# Patient Record
Sex: Female | Born: 1985 | Race: White | Hispanic: No | Marital: Married | Smoking: Former smoker
Health system: Southern US, Community
[De-identification: ages and names within clinical notes are randomized; demographics above are authoritative.]

## PROBLEM LIST (undated history)

## (undated) DIAGNOSIS — K219 Gastro-esophageal reflux disease without esophagitis: Secondary | ICD-10-CM

## (undated) HISTORY — DX: Gastro-esophageal reflux disease without esophagitis: K21.9

## (undated) HISTORY — PX: APPENDECTOMY: SHX54

---

## 2016-05-27 DIAGNOSIS — R1031 Right lower quadrant pain: Secondary | ICD-10-CM | POA: Diagnosis not present

## 2016-05-27 DIAGNOSIS — F3281 Premenstrual dysphoric disorder: Secondary | ICD-10-CM | POA: Diagnosis not present

## 2016-05-27 DIAGNOSIS — N921 Excessive and frequent menstruation with irregular cycle: Secondary | ICD-10-CM | POA: Diagnosis not present

## 2016-06-18 DIAGNOSIS — Z124 Encounter for screening for malignant neoplasm of cervix: Secondary | ICD-10-CM | POA: Diagnosis not present

## 2016-06-18 DIAGNOSIS — F3281 Premenstrual dysphoric disorder: Secondary | ICD-10-CM | POA: Diagnosis not present

## 2016-06-18 DIAGNOSIS — Z01419 Encounter for gynecological examination (general) (routine) without abnormal findings: Secondary | ICD-10-CM | POA: Diagnosis not present

## 2017-01-22 DIAGNOSIS — M545 Low back pain: Secondary | ICD-10-CM | POA: Diagnosis not present

## 2017-01-22 DIAGNOSIS — R109 Unspecified abdominal pain: Secondary | ICD-10-CM | POA: Diagnosis not present

## 2017-01-22 DIAGNOSIS — R399 Unspecified symptoms and signs involving the genitourinary system: Secondary | ICD-10-CM | POA: Diagnosis not present

## 2017-01-22 DIAGNOSIS — K5900 Constipation, unspecified: Secondary | ICD-10-CM | POA: Diagnosis not present

## 2017-05-26 DIAGNOSIS — K469 Unspecified abdominal hernia without obstruction or gangrene: Secondary | ICD-10-CM | POA: Diagnosis not present

## 2017-08-23 DIAGNOSIS — Z124 Encounter for screening for malignant neoplasm of cervix: Secondary | ICD-10-CM | POA: Diagnosis not present

## 2017-08-23 DIAGNOSIS — K219 Gastro-esophageal reflux disease without esophagitis: Secondary | ICD-10-CM | POA: Diagnosis not present

## 2017-08-23 DIAGNOSIS — Z01419 Encounter for gynecological examination (general) (routine) without abnormal findings: Secondary | ICD-10-CM | POA: Diagnosis not present

## 2017-08-23 DIAGNOSIS — K429 Umbilical hernia without obstruction or gangrene: Secondary | ICD-10-CM | POA: Diagnosis not present

## 2017-11-11 ENCOUNTER — Encounter: Payer: Self-pay | Admitting: Family Medicine

## 2017-11-11 ENCOUNTER — Ambulatory Visit (INDEPENDENT_AMBULATORY_CARE_PROVIDER_SITE_OTHER): Payer: BLUE CROSS/BLUE SHIELD | Admitting: Family Medicine

## 2017-11-11 VITALS — BP 118/72 | HR 87 | Temp 98.2°F | Ht 62.0 in | Wt 129.8 lb

## 2017-11-11 DIAGNOSIS — R1310 Dysphagia, unspecified: Secondary | ICD-10-CM

## 2017-11-11 DIAGNOSIS — H6692 Otitis media, unspecified, left ear: Secondary | ICD-10-CM

## 2017-11-11 MED ORDER — AMOXICILLIN 875 MG PO TABS
875.0000 mg | ORAL_TABLET | Freq: Two times a day (BID) | ORAL | 0 refills | Status: DC
Start: 2017-11-11 — End: 2017-11-28

## 2017-11-11 MED ORDER — IPRATROPIUM BROMIDE 0.06 % NA SOLN: 2.0000 | Freq: Four times a day (QID) | NASAL | 0 refills | Status: DC

## 2017-11-11 NOTE — Progress Notes (Signed)
Subjective:  Andrea Lester is a 32 y.o. female who presents today with a chief complaint of left ear pressure and to establish care.   HPI:  Left ear pressure, acute issue Started 4 days ago.  Symptoms worsened over that time.  Associated with cough and sore throat.  She is tried Sudafed and lozenges which is helped a little bit.  Both of her daughters have had similar symptoms.  No fevers or chills.  No nausea or vomiting.  No hearing loss.  No other home treatments tried.  No other obvious alleviating or aggravating factors.  Dysphagia, new problem Several year history.  This was being worked up in [place], [place] prior to her moving to [place] last month.  Reports that she was planning on having a endoscopy done.  Still having some issues with dysphasia and feeling like food is getting stuck in her throat.  She was started on Nexium which has not significantly seem to help.  ROS: Per HPI, otherwise a 10 point review of systems was performed and was negative  PMH:  The following were reviewed and entered/updated in epic: Past Medical History:  Diagnosis Date  . GERD (gastroesophageal reflux disease)    Patient Active Problem List   Diagnosis Date Noted  . Dysphagia 11/11/2017   History reviewed. No pertinent surgical history.  No family history of cancer.  Medications- reviewed and updated Current Outpatient Medications  Medication Sig Dispense Refill  . esomeprazole (NEXIUM) 40 MG capsule Take 40 mg by mouth daily at 12 noon.    Marland Kitchen amoxicillin (AMOXIL) 875 MG tablet Take 1 tablet (875 mg total) by mouth 2 (two) times daily. 14 tablet 0  . ipratropium (ATROVENT) 0.06 % nasal spray Place 2 sprays into both nostrils 4 (four) times daily. 15 mL 0   No current facility-administered medications for this visit.     Allergies-reviewed and updated No Known Allergies  Social History   Socioeconomic History  . Marital status: Married    Spouse name: None  . Number  of children: None  . Years of education: None  . Highest education level: None  Social Needs  . Financial resource strain: None  . Food insecurity - worry: None  . Food insecurity - inability: None  . Transportation needs - medical: None  . Transportation needs - non-medical: None  Occupational History  . None  Tobacco Use  . Smoking status: Former Games developer  . Smokeless tobacco: Never Used  Substance and Sexual Activity  . Alcohol use: Yes  . Drug use: No  . Sexual activity: Yes    Partners: Male  Other Topics Concern  . None  Social History Narrative  . None    Objective:  Physical Exam: BP 118/72 (BP Location: Left Arm, Patient Position: Sitting, Cuff Size: Normal)   Pulse 87   Temp 98.2 F (36.8 C) (Oral)   Ht 5\' 2"  (1.575 m)   Wt 129 lb 12.8 oz (58.9 kg)   SpO2 97%   BMI 23.74 kg/m   Gen: NAD, resting comfortably HEENT: Right TM clear.  Left TM erythematous with effusion.  Oropharynx erythematous without exudate.  Shotty lymphadenopathy. CV: RRR with no murmurs appreciated Pulm: NWOB, CTAB with no crackles, wheezes, or rhonchi GI: Normal bowel sounds present. Soft, Nontender, Nondistended. MSK: No edema, cyanosis, or clubbing noted Skin: Warm, dry Neuro: Grossly normal, moves all extremities Psych: Normal affect and thought content  Assessment/Plan:  Left otitis media Start amoxicillin.  Also start Atrovent  nasal spray for rhinorrhea/nasal congestion.  Recommended Tylenol and/or Motrin as needed for pain.  Encouraged good oral hydration.  Return precautions reviewed.  Follow-up as needed.  Dysphagia Continue Nexium.  Referral to GI placed.  Preventative healthcare Advised patient to return soon for CPE.  Obtain records from previous PCP.  Katina Degreealeb M. Jimmey RalphParker, MD 11/11/2017 3:39 PM

## 2017-11-11 NOTE — Patient Instructions (Signed)
Start the atrovent and amoxicillin.  I will refer you to GI.  Please drink plenty of fluids.  Let me know if your symptoms worsen, or fail to improve in the next few days.  Come back soon for a full physical.  Take care, Dr Jimmey RalphParker

## 2017-11-28 ENCOUNTER — Ambulatory Visit: Payer: BLUE CROSS/BLUE SHIELD | Admitting: Family Medicine

## 2017-11-28 ENCOUNTER — Encounter: Payer: Self-pay | Admitting: Family Medicine

## 2017-11-28 VITALS — BP 116/70 | HR 72 | Temp 98.4°F | Ht 62.0 in | Wt 130.2 lb

## 2017-11-28 DIAGNOSIS — R1310 Dysphagia, unspecified: Secondary | ICD-10-CM

## 2017-11-28 DIAGNOSIS — R059 Cough, unspecified: Secondary | ICD-10-CM

## 2017-11-28 DIAGNOSIS — R05 Cough: Secondary | ICD-10-CM | POA: Diagnosis not present

## 2017-11-28 MED ORDER — PREDNISONE 50 MG PO TABS
ORAL_TABLET | ORAL | 0 refills | Status: DC
Start: 2017-11-28 — End: 2018-11-13

## 2017-11-28 MED ORDER — IPRATROPIUM BROMIDE 0.06 % NA SOLN: 2.0000 | Freq: Four times a day (QID) | NASAL | 0 refills | Status: DC

## 2017-11-28 MED ORDER — DOXYCYCLINE HYCLATE 100 MG PO TABS: 100.0000 mg | ORAL_TABLET | Freq: Two times a day (BID) | ORAL | 0 refills | Status: DC

## 2017-11-28 NOTE — Progress Notes (Signed)
    Subjective:  Andrea Lester is a 32 y.o. female who presents today for same-day appointment with a chief complaint of sinusitis.   HPI:  Cough, acute issue Symptoms started about a week ago.  Stable over that time.  Associated with sore throat, rhinorrhea, and sinus congestion.  No fevers or chills.  Both of her children have also been sick.  She has tried over-the-counter cough medications which is helped a little bit.  No ear pain or pressure.  No other obvious alleviating or aggravating factors.  Dysphagia, established problem, stable Patient seen for this about 3 weeks ago.  Referral was placed to GI however patient has not yet heard from them.  ROS: Per HPI  PMH: She reports that she has quit smoking. she has never used smokeless tobacco. She reports that she drinks alcohol. She reports that she does not use drugs.  Objective:  Physical Exam: BP 116/70 (BP Location: Left Arm, Patient Position: Sitting, Cuff Size: Normal)   Pulse 72   Temp 98.4 F (36.9 C) (Oral)   Ht 5\' 2"  (1.575 m)   Wt 130 lb 3.2 oz (59.1 kg)   SpO2 98%   BMI 23.81 kg/m   Gen: NAD, resting comfortably HEENT: Right TM clear.  Left TM with clear effusion.  Oropharynx erythematous without exudate.  Nasal mucosa erythematous and boggy with clear nasal discharge.  Maxillary sinuses clear to transillumination bilaterally. CV: RRR with no murmurs appreciated Pulm: NWOB, CTAB with no crackles, wheezes, or rhonchi  Assessment/Plan:  Cough Likely secondary to viral URI. No signs of bacterial infection. Start atrovent for rhinorrhea/sinus congestion. Will give 2 day burst of prednisone. Sent in a "pocket prescription" for doxycycline with strict instruction to not start unless symptoms worse or fail to improve within the next several days. Recommended tylenol and/or motrin as needed for low grade fever and pain. Encouraged good oral hydration. Return precautions reviewed. Follow up as needed.   Dysphagia Told  patient that referral was placed.  While in office, she checked her voicemails and noted that she had a missed call from the GI office.  Advised patient that she should call the GI office to schedule her appointment.  Patient voiced understanding.   Katina Degreealeb M. Jimmey RalphParker, MD 11/28/2017 1:06 PM

## 2017-11-28 NOTE — Patient Instructions (Signed)
Start the atrovent and prednisone.  Start the doxycycline if your symptoms worsen or do not improve in a few days.  Please stay well hydrated.  You can take tylenol and/or motrin as needed for low grade fever and pain.  Please let me know if your symptoms worsen or fail to improve.  Call [facility] to schedule your appointment: Address: [address], [place], [place] [address] Phone: [phone]  Take care, Dr Jimmey RalphParker

## 2018-01-09 ENCOUNTER — Encounter: Payer: Self-pay | Admitting: Family Medicine

## 2018-02-20 DIAGNOSIS — J018 Other acute sinusitis: Secondary | ICD-10-CM | POA: Diagnosis not present

## 2018-02-20 DIAGNOSIS — J309 Allergic rhinitis, unspecified: Secondary | ICD-10-CM | POA: Diagnosis not present

## 2018-03-15 DIAGNOSIS — N6459 Other signs and symptoms in breast: Secondary | ICD-10-CM | POA: Diagnosis not present

## 2018-03-15 DIAGNOSIS — R102 Pelvic and perineal pain: Secondary | ICD-10-CM | POA: Diagnosis not present

## 2018-03-16 ENCOUNTER — Other Ambulatory Visit: Payer: Self-pay | Admitting: Obstetrics & Gynecology

## 2018-03-16 DIAGNOSIS — N6320 Unspecified lump in the left breast, unspecified quadrant: Secondary | ICD-10-CM

## 2018-04-14 ENCOUNTER — Other Ambulatory Visit: Payer: BLUE CROSS/BLUE SHIELD

## 2018-04-17 ENCOUNTER — Other Ambulatory Visit: Payer: BLUE CROSS/BLUE SHIELD

## 2018-04-18 ENCOUNTER — Ambulatory Visit
Admission: RE | Admit: 2018-04-18 | Discharge: 2018-04-18 | Disposition: A | Discharge status: Home / Self Care | Payer: BLUE CROSS/BLUE SHIELD | Source: Ambulatory Visit | Attending: Obstetrics & Gynecology | Admitting: Obstetrics & Gynecology

## 2018-04-18 DIAGNOSIS — N6489 Other specified disorders of breast: Secondary | ICD-10-CM | POA: Diagnosis not present

## 2018-04-18 DIAGNOSIS — N6320 Unspecified lump in the left breast, unspecified quadrant: Secondary | ICD-10-CM

## 2018-04-18 DIAGNOSIS — R922 Inconclusive mammogram: Secondary | ICD-10-CM | POA: Diagnosis not present

## 2018-09-05 DIAGNOSIS — M9901 Segmental and somatic dysfunction of cervical region: Secondary | ICD-10-CM | POA: Diagnosis not present

## 2018-09-05 DIAGNOSIS — M4003 Postural kyphosis, cervicothoracic region: Secondary | ICD-10-CM | POA: Diagnosis not present

## 2018-09-05 DIAGNOSIS — M9905 Segmental and somatic dysfunction of pelvic region: Secondary | ICD-10-CM | POA: Diagnosis not present

## 2018-09-05 DIAGNOSIS — Q72812 Congenital shortening of left lower limb: Secondary | ICD-10-CM | POA: Diagnosis not present

## 2018-09-07 DIAGNOSIS — M4003 Postural kyphosis, cervicothoracic region: Secondary | ICD-10-CM | POA: Diagnosis not present

## 2018-09-07 DIAGNOSIS — M9901 Segmental and somatic dysfunction of cervical region: Secondary | ICD-10-CM | POA: Diagnosis not present

## 2018-09-07 DIAGNOSIS — Q72812 Congenital shortening of left lower limb: Secondary | ICD-10-CM | POA: Diagnosis not present

## 2018-09-07 DIAGNOSIS — M9905 Segmental and somatic dysfunction of pelvic region: Secondary | ICD-10-CM | POA: Diagnosis not present

## 2018-09-12 DIAGNOSIS — M5382 Other specified dorsopathies, cervical region: Secondary | ICD-10-CM | POA: Diagnosis not present

## 2018-09-12 DIAGNOSIS — M9901 Segmental and somatic dysfunction of cervical region: Secondary | ICD-10-CM | POA: Diagnosis not present

## 2018-09-12 DIAGNOSIS — M9905 Segmental and somatic dysfunction of pelvic region: Secondary | ICD-10-CM | POA: Diagnosis not present

## 2018-09-12 DIAGNOSIS — Q72812 Congenital shortening of left lower limb: Secondary | ICD-10-CM | POA: Diagnosis not present

## 2018-09-15 DIAGNOSIS — M5382 Other specified dorsopathies, cervical region: Secondary | ICD-10-CM | POA: Diagnosis not present

## 2018-09-15 DIAGNOSIS — M9905 Segmental and somatic dysfunction of pelvic region: Secondary | ICD-10-CM | POA: Diagnosis not present

## 2018-09-15 DIAGNOSIS — Q72812 Congenital shortening of left lower limb: Secondary | ICD-10-CM | POA: Diagnosis not present

## 2018-09-15 DIAGNOSIS — M9901 Segmental and somatic dysfunction of cervical region: Secondary | ICD-10-CM | POA: Diagnosis not present

## 2018-09-26 DIAGNOSIS — M955 Acquired deformity of pelvis: Secondary | ICD-10-CM | POA: Diagnosis not present

## 2018-09-26 DIAGNOSIS — Q760 Spina bifida occulta: Secondary | ICD-10-CM | POA: Diagnosis not present

## 2018-09-28 DIAGNOSIS — M9901 Segmental and somatic dysfunction of cervical region: Secondary | ICD-10-CM | POA: Diagnosis not present

## 2018-09-28 DIAGNOSIS — M5382 Other specified dorsopathies, cervical region: Secondary | ICD-10-CM | POA: Diagnosis not present

## 2018-09-28 DIAGNOSIS — Q72812 Congenital shortening of left lower limb: Secondary | ICD-10-CM | POA: Diagnosis not present

## 2018-09-28 DIAGNOSIS — M9905 Segmental and somatic dysfunction of pelvic region: Secondary | ICD-10-CM | POA: Diagnosis not present

## 2018-10-03 DIAGNOSIS — M9901 Segmental and somatic dysfunction of cervical region: Secondary | ICD-10-CM | POA: Diagnosis not present

## 2018-10-03 DIAGNOSIS — M9905 Segmental and somatic dysfunction of pelvic region: Secondary | ICD-10-CM | POA: Diagnosis not present

## 2018-10-03 DIAGNOSIS — M5382 Other specified dorsopathies, cervical region: Secondary | ICD-10-CM | POA: Diagnosis not present

## 2018-10-03 DIAGNOSIS — Q72812 Congenital shortening of left lower limb: Secondary | ICD-10-CM | POA: Diagnosis not present

## 2018-10-05 DIAGNOSIS — M9901 Segmental and somatic dysfunction of cervical region: Secondary | ICD-10-CM | POA: Diagnosis not present

## 2018-10-05 DIAGNOSIS — M9905 Segmental and somatic dysfunction of pelvic region: Secondary | ICD-10-CM | POA: Diagnosis not present

## 2018-10-05 DIAGNOSIS — M5382 Other specified dorsopathies, cervical region: Secondary | ICD-10-CM | POA: Diagnosis not present

## 2018-10-05 DIAGNOSIS — Q72812 Congenital shortening of left lower limb: Secondary | ICD-10-CM | POA: Diagnosis not present

## 2018-10-10 DIAGNOSIS — M5382 Other specified dorsopathies, cervical region: Secondary | ICD-10-CM | POA: Diagnosis not present

## 2018-10-10 DIAGNOSIS — M9901 Segmental and somatic dysfunction of cervical region: Secondary | ICD-10-CM | POA: Diagnosis not present

## 2018-10-10 DIAGNOSIS — M9905 Segmental and somatic dysfunction of pelvic region: Secondary | ICD-10-CM | POA: Diagnosis not present

## 2018-10-10 DIAGNOSIS — Q72812 Congenital shortening of left lower limb: Secondary | ICD-10-CM | POA: Diagnosis not present

## 2018-10-24 DIAGNOSIS — Q72812 Congenital shortening of left lower limb: Secondary | ICD-10-CM | POA: Diagnosis not present

## 2018-10-24 DIAGNOSIS — M9905 Segmental and somatic dysfunction of pelvic region: Secondary | ICD-10-CM | POA: Diagnosis not present

## 2018-10-24 DIAGNOSIS — M5382 Other specified dorsopathies, cervical region: Secondary | ICD-10-CM | POA: Diagnosis not present

## 2018-10-24 DIAGNOSIS — M9901 Segmental and somatic dysfunction of cervical region: Secondary | ICD-10-CM | POA: Diagnosis not present

## 2018-10-31 DIAGNOSIS — M9905 Segmental and somatic dysfunction of pelvic region: Secondary | ICD-10-CM | POA: Diagnosis not present

## 2018-10-31 DIAGNOSIS — M5382 Other specified dorsopathies, cervical region: Secondary | ICD-10-CM | POA: Diagnosis not present

## 2018-10-31 DIAGNOSIS — Q72812 Congenital shortening of left lower limb: Secondary | ICD-10-CM | POA: Diagnosis not present

## 2018-10-31 DIAGNOSIS — M9901 Segmental and somatic dysfunction of cervical region: Secondary | ICD-10-CM | POA: Diagnosis not present

## 2018-11-07 DIAGNOSIS — M9905 Segmental and somatic dysfunction of pelvic region: Secondary | ICD-10-CM | POA: Diagnosis not present

## 2018-11-07 DIAGNOSIS — M9901 Segmental and somatic dysfunction of cervical region: Secondary | ICD-10-CM | POA: Diagnosis not present

## 2018-11-07 DIAGNOSIS — Q72812 Congenital shortening of left lower limb: Secondary | ICD-10-CM | POA: Diagnosis not present

## 2018-11-07 DIAGNOSIS — M5382 Other specified dorsopathies, cervical region: Secondary | ICD-10-CM | POA: Diagnosis not present

## 2018-11-13 ENCOUNTER — Ambulatory Visit: Payer: BLUE CROSS/BLUE SHIELD | Admitting: Physician Assistant

## 2018-11-13 ENCOUNTER — Encounter: Payer: Self-pay | Admitting: Physician Assistant

## 2018-11-13 VITALS — BP 110/72 | HR 59 | Temp 97.7°F | Ht 62.0 in | Wt 137.0 lb

## 2018-11-13 DIAGNOSIS — R21 Rash and other nonspecific skin eruption: Secondary | ICD-10-CM | POA: Diagnosis not present

## 2018-11-13 DIAGNOSIS — L237 Allergic contact dermatitis due to plants, except food: Secondary | ICD-10-CM | POA: Diagnosis not present

## 2018-11-13 MED ORDER — METHYLPREDNISOLONE ACETATE 80 MG/ML IJ SUSP
80.0000 mg | Freq: Once | INTRAMUSCULAR | Status: AC
  Administered 2018-11-13: 80 mg via INTRAMUSCULAR

## 2018-11-13 MED ORDER — PREDNISONE 10 MG PO TABS: ORAL_TABLET | ORAL | 0 refills | Status: DC

## 2018-11-13 NOTE — Patient Instructions (Signed)
It was great to see you!  1. Start oral steroid TOMORROW. Take two tablets (20mg ) daily x 1 week, then one tablet (10mg ) daily x 1 week. 2. Start daily antihistamine such as Allegra, Claritin, or Zyrtec (generic is fine) x 2 weeks. 3. Start daily antacid medication, such as cimetidine or pepcid, x 2 weeks.   Take care,  Jarold Motto PA-C   Poison [facility] Dermatitis  Poison oak dermatitis is inflammation of the skin that is caused by contact with the allergens on the leaves of the poison oak (toxicodendron) plant. The skin reaction often includes redness, swelling, blisters, and extreme itching. What are the causes? This condition is caused by a specific chemical (urushiol) that is found in the sap of the poison oak plant. This chemical is sticky and it can be easily spread to people, animals, and objects. You can get poison oak dermatitis by:  Having direct contact with a poison oak plant.  Touching animals, other people, or objects that have come in contact with poison oak and have the chemical on them. What increases the risk? This condition is more likely to develop in people who:  Are outdoors often.  Go outdoors without wearing protective clothing, such as closed shoes, long pants, and a long-sleeved shirt. What are the signs or symptoms? Symptoms of this condition include:  Redness of the skin.  A rash that may develop blisters.  Extreme itching.  Swelling. This may occur if the reaction is more severe. Symptoms usually last for 1-2 weeks. However, the first time you develop this condition, symptoms may last 3-4 weeks. How is this diagnosed? This condition may be diagnosed based on your symptoms and a physical exam. Your health care provider may also ask you about any recent outdoor activity. How is this treated? Treatment for this condition will vary depending on how severe it is. Treatment may include:  Hydrocortisone creams or calamine lotions to relieve  itching.  Oatmeal baths to soothe the skin.  Over-the-counter antihistamine tablets.  Oral steroid medicine for more severe outbreaks. Follow these instructions at home:  Take or apply over-the-counter and prescription medicines only as told by your health care provider.  Wash exposed skin as soon as possible with soap and cold water.  Use hydrocortisone creams or calamine lotion as needed to soothe the skin and relieve itching.  Take oatmeal baths as needed. Use colloidal oatmeal. You can get this at your local pharmacy or grocery store. Follow the instructions on the packaging.  Do not scratch or rub your skin.  While you have the rash, wash clothes right after you wear them. How is this prevented?  Learn to identify the poison oak plant and avoid contact with the plant. This plant can be recognized by the number of leaves. Generally, poison oak has three leaves with flowering branches on a single stem. The leaves are often a bit fuzzy and have a toothlike edge.  If you have been exposed to poison oak, thoroughly wash with soap and water right away. You have about 30 minutes to remove the plant resin before it will cause the rash. Be sure to wash under your fingernails because any plant resin there will continue to spread the rash.  When hiking or camping, wear clothes that will help you avoid exposure on the skin. This includes long pants, a long-sleeved shirt, tall socks, and hiking boots. You can also apply preventive lotion to your skin to help limit exposure.  If you suspect that your  clothes or outdoor gear came in contact with poison oak, rinse them off outside with a garden hose before bringing them inside your house. Contact a health care provider if:  You have open sores in the rash area.  You have more redness, swelling, or pain in the affected area.  You have redness that spreads beyond the rash area.  You have fluid, blood, or pus coming from the affected  area.  You have a fever.  You have a rash over a large area of your body.  You have a rash on your eyes, mouth, or genitals.  Your rash does not improve after a few days. Get help right away if:  Your face swells or your eyes swell shut.  You have trouble breathing.  You have trouble swallowing. This information is not intended to replace advice given to you by your health care provider. Make sure you discuss any questions you have with your health care provider. Document Released: 04/17/2003 Document Revised: 05/26/2017 Document Reviewed: 03/19/2015 Elsevier Interactive Patient Education  2019 [employer].

## 2018-11-13 NOTE — Progress Notes (Signed)
Andrea Lester is a 33 y.o. female here for a new problem.  I acted as a Neurosurgeon for [employer], PA-C Corky Mull, LPN  History of Present Illness:   Chief Complaint  Patient presents with  . Poison [employer]  This is a new problem. The current episode started in the past 7 days. The problem has been gradually worsening since onset. The rash is diffuse. The rash is characterized by redness, blistering, itchiness and scaling. She was exposed to plant contact (Pulling weeds in yard last Sunday). Pertinent negatives include no cough, diarrhea, facial edema, fever, joint pain, shortness of breath, sore throat or vomiting. Past treatments include antihistamine, topical steroids and anti-itch cream. The treatment provided mild relief.   She has received oral steroids in the past and tolerated without issues.  She has tried oral benadryl but it makes her sleepy.  Past Medical History:  Diagnosis Date  . GERD (gastroesophageal reflux disease)      Social History   Socioeconomic History  . Marital status: Married    Spouse name: Not on file  . Number of children: Not on file  . Years of education: Not on file  . Highest education level: Not on file  Occupational History  . Not on file  Social Needs  . Financial resource strain: Not on file  . Food insecurity:    Worry: Not on file    Inability: Not on file  . Transportation needs:    Medical: Not on file    Non-medical: Not on file  Tobacco Use  . Smoking status: Former Games developer  . Smokeless tobacco: Never Used  Substance and Sexual Activity  . Alcohol use: Yes  . Drug use: No  . Sexual activity: Yes    Partners: Male  Lifestyle  . Physical activity:    Days per week: Not on file    Minutes per session: Not on file  . Stress: Not on file  Relationships  . Social connections:    Talks on phone: Not on file    Gets together: Not on file    Attends religious service: Not on file    Active member of club or  organization: Not on file    Attends meetings of clubs or organizations: Not on file    Relationship status: Not on file  . Intimate partner violence:    Fear of current or ex partner: Not on file    Emotionally abused: Not on file    Physically abused: Not on file    Forced sexual activity: Not on file  Other Topics Concern  . Not on file  Social History Narrative  . Not on file    History reviewed. No pertinent surgical history.  History reviewed. No pertinent family history.  No Known Allergies  Current Medications:   Current Outpatient Medications:  .  predniSONE (DELTASONE) 10 MG tablet, Take two tablets daily x 1 week, then one tablet daily x 1 week., Disp: 21 tablet, Rfl: 0   Review of Systems:   Review of Systems  Constitutional: Negative for fever.  HENT: Negative for sore throat.   Respiratory: Negative for cough and shortness of breath.   Gastrointestinal: Negative for diarrhea and vomiting.  Musculoskeletal: Negative for joint pain.    Vitals:   Vitals:   11/13/18 0806  BP: 110/72  Pulse: (!) 59  Temp: 97.7 F (36.5 C)  TempSrc: Oral  SpO2: 98%  Weight: 137 lb (62.1 kg)  Height: 5\' 2"  (1.575 m)     Body mass index is 25.06 kg/m.  Physical Exam:   Physical Exam Vitals signs and nursing note reviewed.  Constitutional:      General: She is not in acute distress.    Appearance: She is well-developed. She is not ill-appearing or toxic-appearing.  Cardiovascular:     Rate and Rhythm: Normal rate and regular rhythm.     Pulses: Normal pulses.     Heart sounds: Normal heart sounds, S1 normal and S2 normal.     Comments: No LE edema Pulmonary:     Effort: Pulmonary effort is normal.     Breath sounds: Normal breath sounds.  Skin:    General: Skin is warm and dry.     Comments: Erythematous patches with sand-paper like texture spread throughout upper chest and bilateral upper extremities  Neurological:     Mental Status: She is alert.     GCS:  GCS eye subscore is 4. GCS verbal subscore is 5. GCS motor subscore is 6.  Psychiatric:        Speech: Speech normal.        Behavior: Behavior normal. Behavior is cooperative.     No results found for this or any previous visit.  Assessment and Plan:   [name] was seen today for poison ivy.  Diagnoses and all orders for this visit:  Rash and nonspecific skin eruption No red flags on exam.  She has not responded to OTC remedies. Received depo-medrol injection in office today. Start oral prednisone tomorrow. Also start daily non-sedating antihistamine and H2 blocker for at least 1 week.  Discussed taking medications as prescribed. Reviewed return precautions and ER precautions. -     methylPREDNISolone acetate (DEPO-MEDROL) injection 80 mg  Other orders -     predniSONE (DELTASONE) 10 MG tablet; Take two tablets daily x 1 week, then one tablet daily x 1 week.   . Reviewed expectations re: course of current medical issues. . Discussed self-management of symptoms. . Outlined signs and symptoms indicating need for more acute intervention. . Patient verbalized understanding and all questions were answered. . See orders for this visit as documented in the electronic medical record. . Patient received an After-Visit Summary.  CMA or LPN served as scribe during this visit. History, Physical, and Plan performed by medical provider. The above documentation has been reviewed and is accurate and complete.   Jarold MottoSamantha Simisola Sandles, PA-C

## 2018-11-14 ENCOUNTER — Ambulatory Visit: Payer: BLUE CROSS/BLUE SHIELD | Admitting: Family Medicine

## 2018-11-16 ENCOUNTER — Ambulatory Visit: Payer: Self-pay

## 2018-11-16 NOTE — Telephone Encounter (Addendum)
Left message on voicemail to call office. Discussed message with Lelon Mast. Pt can decrease Prednisone to 10 mg daily x 1 week and then decrease to 5 mg x 1 week due to side effect.   CRM CREATED

## 2018-11-16 NOTE — Telephone Encounter (Signed)
See note

## 2018-11-16 NOTE — Telephone Encounter (Signed)
Pt called to say she started to take prednisone for her rash on Monday.  Her instructions are to take 2 per day week 1. Then 1 per day week 2. She is wanting to decrease the dose because of the side effects. Pt is taking medication in the morning with food.  Pt was educated on how prednisone works and that decreasing the dose could mean a return of her symptoms. Note will be routed to provider.Pt advised to contact pharmacist for advice . Pt verbalized understanding of all information.   Reason for Disposition . Caller has medication question about med not prescribed by PCP and triager unable to answer question (e.g., compatibility with other med, storage)  Answer Assessment - Initial Assessment Questions 1. SYMPTOMS: "Do you have any symptoms?"     Still itchy.  No spread of the rash 2. SEVERITY: If symptoms are present, ask "Are they mild, moderate or severe?"     Still itch but improving no spreading.  Medications makes her feel edgy  Protocols used: MEDICATION QUESTION CALL-A-AH

## 2018-11-17 NOTE — Telephone Encounter (Signed)
Spoke to pt, told her calling about Prednisone,  Discussed message with Lelon Mast. you can decrease Prednisone to 10 mg daily x 1 week and then decrease to 5 mg x 1 week due to side effect. Pt verbalized understanding.

## 2018-11-21 DIAGNOSIS — M5382 Other specified dorsopathies, cervical region: Secondary | ICD-10-CM | POA: Diagnosis not present

## 2018-11-21 DIAGNOSIS — M9901 Segmental and somatic dysfunction of cervical region: Secondary | ICD-10-CM | POA: Diagnosis not present

## 2018-11-21 DIAGNOSIS — M9905 Segmental and somatic dysfunction of pelvic region: Secondary | ICD-10-CM | POA: Diagnosis not present

## 2018-11-21 DIAGNOSIS — Q72812 Congenital shortening of left lower limb: Secondary | ICD-10-CM | POA: Diagnosis not present

## 2019-04-30 DIAGNOSIS — Z1331 Encounter for screening for depression: Secondary | ICD-10-CM | POA: Diagnosis not present

## 2019-04-30 DIAGNOSIS — F329 Major depressive disorder, single episode, unspecified: Secondary | ICD-10-CM | POA: Diagnosis not present

## 2019-04-30 DIAGNOSIS — F419 Anxiety disorder, unspecified: Secondary | ICD-10-CM | POA: Diagnosis not present

## 2019-04-30 DIAGNOSIS — J309 Allergic rhinitis, unspecified: Secondary | ICD-10-CM | POA: Diagnosis not present

## 2019-04-30 DIAGNOSIS — M2550 Pain in unspecified joint: Secondary | ICD-10-CM | POA: Diagnosis not present

## 2019-05-28 ENCOUNTER — Other Ambulatory Visit: Payer: Self-pay

## 2019-05-28 DIAGNOSIS — R6889 Other general symptoms and signs: Secondary | ICD-10-CM | POA: Diagnosis not present

## 2019-05-28 DIAGNOSIS — Z20822 Contact with and (suspected) exposure to covid-19: Secondary | ICD-10-CM

## 2019-05-29 DIAGNOSIS — B9729 Other coronavirus as the cause of diseases classified elsewhere: Secondary | ICD-10-CM | POA: Diagnosis not present

## 2019-05-29 LAB — NOVEL CORONAVIRUS, NAA: SARS-CoV-2, NAA: DETECTED — AB

## 2019-05-30 ENCOUNTER — Ambulatory Visit: Payer: Self-pay

## 2019-05-30 NOTE — Telephone Encounter (Signed)
Patient requesting to review Covid-19 protocol.,

## 2019-06-01 NOTE — Telephone Encounter (Signed)
Patient has no question was tested positive for covid 19.

## 2019-07-20 DIAGNOSIS — Z20828 Contact with and (suspected) exposure to other viral communicable diseases: Secondary | ICD-10-CM | POA: Diagnosis not present

## 2019-08-20 DIAGNOSIS — Z87891 Personal history of nicotine dependence: Secondary | ICD-10-CM | POA: Diagnosis not present

## 2019-08-20 DIAGNOSIS — Z7289 Other problems related to lifestyle: Secondary | ICD-10-CM | POA: Diagnosis not present

## 2019-08-20 DIAGNOSIS — R430 Anosmia: Secondary | ICD-10-CM | POA: Diagnosis not present

## 2019-08-20 DIAGNOSIS — Z8709 Personal history of other diseases of the respiratory system: Secondary | ICD-10-CM | POA: Diagnosis not present

## 2019-10-08 DIAGNOSIS — H9313 Tinnitus, bilateral: Secondary | ICD-10-CM | POA: Diagnosis not present

## 2019-10-08 DIAGNOSIS — R438 Other disturbances of smell and taste: Secondary | ICD-10-CM | POA: Diagnosis not present

## 2019-11-06 DIAGNOSIS — H9313 Tinnitus, bilateral: Secondary | ICD-10-CM | POA: Diagnosis not present

## 2019-12-04 DIAGNOSIS — Z0000 Encounter for general adult medical examination without abnormal findings: Secondary | ICD-10-CM | POA: Diagnosis not present

## 2019-12-04 DIAGNOSIS — R7989 Other specified abnormal findings of blood chemistry: Secondary | ICD-10-CM | POA: Diagnosis not present

## 2019-12-04 DIAGNOSIS — M5412 Radiculopathy, cervical region: Secondary | ICD-10-CM | POA: Diagnosis not present

## 2019-12-04 DIAGNOSIS — M5413 Radiculopathy, cervicothoracic region: Secondary | ICD-10-CM | POA: Diagnosis not present

## 2019-12-04 DIAGNOSIS — M9901 Segmental and somatic dysfunction of cervical region: Secondary | ICD-10-CM | POA: Diagnosis not present

## 2019-12-04 DIAGNOSIS — M7912 Myalgia of auxiliary muscles, head and neck: Secondary | ICD-10-CM | POA: Diagnosis not present

## 2019-12-06 DIAGNOSIS — M5412 Radiculopathy, cervical region: Secondary | ICD-10-CM | POA: Diagnosis not present

## 2019-12-06 DIAGNOSIS — M9901 Segmental and somatic dysfunction of cervical region: Secondary | ICD-10-CM | POA: Diagnosis not present

## 2019-12-06 DIAGNOSIS — M7912 Myalgia of auxiliary muscles, head and neck: Secondary | ICD-10-CM | POA: Diagnosis not present

## 2019-12-06 DIAGNOSIS — M5413 Radiculopathy, cervicothoracic region: Secondary | ICD-10-CM | POA: Diagnosis not present

## 2019-12-06 DIAGNOSIS — R82998 Other abnormal findings in urine: Secondary | ICD-10-CM | POA: Diagnosis not present

## 2019-12-07 DIAGNOSIS — F329 Major depressive disorder, single episode, unspecified: Secondary | ICD-10-CM | POA: Diagnosis not present

## 2019-12-07 DIAGNOSIS — N3090 Cystitis, unspecified without hematuria: Secondary | ICD-10-CM | POA: Diagnosis not present

## 2019-12-07 DIAGNOSIS — M2550 Pain in unspecified joint: Secondary | ICD-10-CM | POA: Diagnosis not present

## 2019-12-07 DIAGNOSIS — Z0000 Encounter for general adult medical examination without abnormal findings: Secondary | ICD-10-CM | POA: Diagnosis not present

## 2019-12-07 DIAGNOSIS — R430 Anosmia: Secondary | ICD-10-CM | POA: Diagnosis not present

## 2019-12-11 DIAGNOSIS — M5413 Radiculopathy, cervicothoracic region: Secondary | ICD-10-CM | POA: Diagnosis not present

## 2019-12-11 DIAGNOSIS — M5412 Radiculopathy, cervical region: Secondary | ICD-10-CM | POA: Diagnosis not present

## 2019-12-11 DIAGNOSIS — M9901 Segmental and somatic dysfunction of cervical region: Secondary | ICD-10-CM | POA: Diagnosis not present

## 2019-12-11 DIAGNOSIS — M7912 Myalgia of auxiliary muscles, head and neck: Secondary | ICD-10-CM | POA: Diagnosis not present

## 2019-12-11 DIAGNOSIS — Z1212 Encounter for screening for malignant neoplasm of rectum: Secondary | ICD-10-CM | POA: Diagnosis not present

## 2020-01-04 ENCOUNTER — Encounter (HOSPITAL_COMMUNITY): Payer: Self-pay

## 2020-01-04 ENCOUNTER — Other Ambulatory Visit: Payer: Self-pay

## 2020-01-04 DIAGNOSIS — K659 Peritonitis, unspecified: Secondary | ICD-10-CM | POA: Diagnosis not present

## 2020-01-04 DIAGNOSIS — K5792 Diverticulitis of intestine, part unspecified, without perforation or abscess without bleeding: Secondary | ICD-10-CM | POA: Diagnosis not present

## 2020-01-04 DIAGNOSIS — K5732 Diverticulitis of large intestine without perforation or abscess without bleeding: Secondary | ICD-10-CM | POA: Diagnosis not present

## 2020-01-04 DIAGNOSIS — Z20822 Contact with and (suspected) exposure to covid-19: Secondary | ICD-10-CM | POA: Diagnosis present

## 2020-01-04 DIAGNOSIS — Z87891 Personal history of nicotine dependence: Secondary | ICD-10-CM | POA: Diagnosis not present

## 2020-01-04 DIAGNOSIS — R1011 Right upper quadrant pain: Secondary | ICD-10-CM | POA: Diagnosis not present

## 2020-01-04 DIAGNOSIS — R0602 Shortness of breath: Secondary | ICD-10-CM | POA: Diagnosis not present

## 2020-01-04 DIAGNOSIS — R1031 Right lower quadrant pain: Secondary | ICD-10-CM | POA: Diagnosis not present

## 2020-01-04 LAB — CBC
HCT: 37.7 % (ref 36.0–46.0)
Hemoglobin: 12 g/dL (ref 12.0–15.0)
MCH: 29.9 pg (ref 26.0–34.0)
MCHC: 31.8 g/dL (ref 30.0–36.0)
MCV: 94 fL (ref 80.0–100.0)
Platelets: 186 10*3/uL (ref 150–400)
RBC: 4.01 MIL/uL (ref 3.87–5.11)
RDW: 11.9 % (ref 11.5–15.5)
WBC: 8.9 10*3/uL (ref 4.0–10.5)
nRBC: 0 % (ref 0.0–0.2)

## 2020-01-04 LAB — LIPASE, BLOOD: Lipase: 32 U/L (ref 11–51)

## 2020-01-04 LAB — COMPREHENSIVE METABOLIC PANEL
ALT: 12 U/L (ref 0–44)
AST: 16 U/L (ref 15–41)
Albumin: 4.4 g/dL (ref 3.5–5.0)
Alkaline Phosphatase: 46 U/L (ref 38–126)
Anion gap: 9 (ref 5–15)
BUN: 9 mg/dL (ref 6–20)
CO2: 26 mmol/L (ref 22–32)
Calcium: 8.8 mg/dL — ABNORMAL LOW (ref 8.9–10.3)
Chloride: 104 mmol/L (ref 98–111)
Creatinine, Ser: 0.56 mg/dL (ref 0.44–1.00)
GFR calc Af Amer: >60 mL/min (ref >60)
GFR calc non Af Amer: >60 mL/min (ref >60)
Glucose, Bld: 104 mg/dL — ABNORMAL HIGH (ref 70–99)
Potassium: 3.8 mmol/L (ref 3.5–5.1)
Sodium: 139 mmol/L (ref 135–145)
Total Bilirubin: 1.4 mg/dL — ABNORMAL HIGH (ref 0.3–1.2)
Total Protein: 6.7 g/dL (ref 6.5–8.1)

## 2020-01-04 LAB — I-STAT BETA HCG BLOOD, ED (MC, WL, AP ONLY): I-stat hCG, quantitative: <5 m[IU]/mL (ref <5)

## 2020-01-04 NOTE — ED Triage Notes (Signed)
Pt reports RLQ pain that started on Wednesday and 1 episode of diarrhea this morning. Pt was sent over here from [facility] for further evaluation. Hx of appendectomy.

## 2020-01-05 ENCOUNTER — Inpatient Hospital Stay (HOSPITAL_COMMUNITY)
Admission: EM | Admit: 2020-01-05 | Discharge: 2020-01-07 | DRG: 391 | Disposition: A | Discharge status: Home / Self Care | Payer: BC Managed Care – PPO | Attending: Internal Medicine | Admitting: Internal Medicine

## 2020-01-05 ENCOUNTER — Emergency Department (HOSPITAL_COMMUNITY): Payer: BC Managed Care – PPO

## 2020-01-05 ENCOUNTER — Encounter (HOSPITAL_COMMUNITY): Payer: Self-pay

## 2020-01-05 DIAGNOSIS — K5732 Diverticulitis of large intestine without perforation or abscess without bleeding: Secondary | ICD-10-CM | POA: Diagnosis not present

## 2020-01-05 DIAGNOSIS — K5792 Diverticulitis of intestine, part unspecified, without perforation or abscess without bleeding: Secondary | ICD-10-CM | POA: Diagnosis present

## 2020-01-05 DIAGNOSIS — Z20822 Contact with and (suspected) exposure to covid-19: Secondary | ICD-10-CM | POA: Diagnosis not present

## 2020-01-05 DIAGNOSIS — K659 Peritonitis, unspecified: Secondary | ICD-10-CM | POA: Diagnosis present

## 2020-01-05 DIAGNOSIS — R1011 Right upper quadrant pain: Secondary | ICD-10-CM | POA: Diagnosis not present

## 2020-01-05 DIAGNOSIS — R0602 Shortness of breath: Secondary | ICD-10-CM | POA: Diagnosis not present

## 2020-01-05 DIAGNOSIS — Z87891 Personal history of nicotine dependence: Secondary | ICD-10-CM | POA: Diagnosis not present

## 2020-01-05 LAB — URINALYSIS, ROUTINE W REFLEX MICROSCOPIC
Bilirubin Urine: NEGATIVE
Glucose, UA: NEGATIVE mg/dL
Hgb urine dipstick: NEGATIVE
Ketones, ur: 20 mg/dL — AB
Leukocytes,Ua: NEGATIVE
Nitrite: NEGATIVE
Protein, ur: NEGATIVE mg/dL
Specific Gravity, Urine: 1.014 (ref 1.005–1.030)
pH: 6 (ref 5.0–8.0)

## 2020-01-05 LAB — SARS CORONAVIRUS 2 (TAT 6-24 HRS): SARS Coronavirus 2: NEGATIVE

## 2020-01-05 MED ORDER — MORPHINE SULFATE (PF) 2 MG/ML IV SOLN
1.0000 mg | INTRAVENOUS | Status: DC | PRN
  Administered 2020-01-05 – 2020-01-07 (×3): 1 mg via INTRAVENOUS
  Filled 2020-01-05 (×4): qty 1

## 2020-01-05 MED ORDER — ONDANSETRON HCL 4 MG PO TABS: 4.0000 mg | ORAL_TABLET | Freq: Four times a day (QID) | ORAL | Status: DC | PRN

## 2020-01-05 MED ORDER — MORPHINE SULFATE (PF) 4 MG/ML IV SOLN
4.0000 mg | Freq: Once | INTRAVENOUS | Status: AC
  Administered 2020-01-05: 4 mg via INTRAVENOUS
  Filled 2020-01-05: qty 1

## 2020-01-05 MED ORDER — MORPHINE SULFATE (PF) 4 MG/ML IV SOLN
4.0000 mg | Freq: Once | INTRAVENOUS | Status: AC
Start: 2020-01-05 — End: 2020-01-05
  Administered 2020-01-05: 4 mg via INTRAVENOUS
  Filled 2020-01-05: qty 1

## 2020-01-05 MED ORDER — ONDANSETRON HCL 4 MG/2ML IJ SOLN
4.0000 mg | Freq: Four times a day (QID) | INTRAMUSCULAR | Status: DC | PRN
  Administered 2020-01-05 – 2020-01-07 (×4): 4 mg via INTRAVENOUS
  Filled 2020-01-05 (×4): qty 2

## 2020-01-05 MED ORDER — CIPROFLOXACIN IN D5W 400 MG/200ML IV SOLN
400.0000 mg | Freq: Once | INTRAVENOUS | Status: AC
  Administered 2020-01-05: 400 mg via INTRAVENOUS
  Filled 2020-01-05: qty 200

## 2020-01-05 MED ORDER — ACETAMINOPHEN 325 MG PO TABS
650.0000 mg | ORAL_TABLET | Freq: Four times a day (QID) | ORAL | Status: DC | PRN
  Administered 2020-01-05 – 2020-01-07 (×5): 650 mg via ORAL
  Filled 2020-01-05 (×7): qty 2

## 2020-01-05 MED ORDER — METRONIDAZOLE IN NACL 5-0.79 MG/ML-% IV SOLN
500.0000 mg | Freq: Once | INTRAVENOUS | Status: AC
  Administered 2020-01-05: 500 mg via INTRAVENOUS
  Filled 2020-01-05: qty 100

## 2020-01-05 MED ORDER — ACETAMINOPHEN 650 MG RE SUPP
650.0000 mg | Freq: Four times a day (QID) | RECTAL | Status: DC | PRN
  Administered 2020-01-05: 650 mg via RECTAL
  Filled 2020-01-05: qty 1

## 2020-01-05 MED ORDER — SODIUM CHLORIDE (PF) 0.9 % IJ SOLN
INTRAMUSCULAR | Status: AC
  Filled 2020-01-05: qty 50

## 2020-01-05 MED ORDER — SODIUM CHLORIDE 0.9 % IV SOLN: INTRAVENOUS | Status: AC

## 2020-01-05 MED ORDER — ONDANSETRON HCL 4 MG/2ML IJ SOLN
4.0000 mg | Freq: Once | INTRAMUSCULAR | Status: AC
Start: 2020-01-05 — End: 2020-01-05
  Administered 2020-01-05: 4 mg via INTRAVENOUS
  Filled 2020-01-05: qty 2

## 2020-01-05 MED ORDER — IOHEXOL 350 MG/ML SOLN
100.0000 mL | Freq: Once | INTRAVENOUS | Status: AC | PRN
  Administered 2020-01-05: 100 mL via INTRAVENOUS

## 2020-01-05 MED ORDER — PIPERACILLIN-TAZOBACTAM 3.375 G IVPB
3.3750 g | Freq: Three times a day (TID) | INTRAVENOUS | Status: DC
  Administered 2020-01-05 – 2020-01-07 (×6): 3.375 g via INTRAVENOUS
  Filled 2020-01-05 (×6): qty 50

## 2020-01-05 NOTE — H&P (Signed)
History and Physical    Andrea Lester DTO:[id number] DOB: 1986/04/12 DOA: 01/05/2020  PCP: Ardith Dark, MD  Patient coming from: Home.  Chief Complaint: Abdominal pain.  HPI: Andrea Lester is a 34 y.o. female with no significant past medical history with family history of Crohn's disease presents to the ER with complaints of abdominal pain.  Patient's abdominal pain started last evening mostly right upper quadrant some nausea.  Earlier in the day patient had some diarrhea.  Last month patient also had some bloody stools which patient attributed to hemorrhoids.  Denies any fever chills.  Since pain was persistent patient came to the ER.  ED Course: In the ER patient had right upper quadrant tenderness and CT abdomen pelvis shows features concerning for distal ascending colitis with phlegmon and peritonitis changes.  Patient also had some pleuritic type of chest pain on deep breathing.  CT angiogram of the chest did not show any PE.  Labs are largely unremarkable.  Covid test is pending.  Dr. Carolynne Edouard on-call general surgeon has been consulted.  Review of Systems: As per HPI, rest all negative.   Past Medical History:  Diagnosis Date  . GERD (gastroesophageal reflux disease)     Past Surgical History:  Procedure Laterality Date  . APPENDECTOMY    . CESAREAN SECTION       reports that she has quit smoking. She has never used smokeless tobacco. She reports current alcohol use. She reports that she does not use drugs.  Allergies  Allergen Reactions  . Sulfa Antibiotics Nausea And Vomiting    Family History  Problem Relation Age of Onset  . Crohn's disease Father     Prior to Admission medications   Medication Sig Start Date End Date Taking? Authorizing Provider  ibuprofen (ADVIL) 200 MG tablet Take 200 mg by mouth every 6 (six) hours as needed for moderate pain.   Yes [provider]  Misc Natural Products (TURMERIC CURCUMIN) CAPS Take 1 capsule by mouth daily.   Yes  [provider]  pseudoephedrine (SUDAFED) 30 MG tablet Take 30 mg by mouth every 4 (four) hours as needed for congestion.   Yes [provider]  predniSONE (DELTASONE) 10 MG tablet Take two tablets daily x 1 week, then one tablet daily x 1 week. Patient not taking: Reported on 01/05/2020 11/13/18   Jarold Motto, PA    Physical Exam: Constitutional: Moderately built and nourished. Vitals:   01/05/20 0500 01/05/20 0530 01/05/20 0545 01/05/20 0630  BP: 123/77 102/67  102/67  Pulse: (!) 110  88 90  Resp:    16  Temp:      TempSrc:      SpO2: 99%  97% 100%   Eyes: Anicteric no pallor. ENMT: No discharge from the ears eyes nose or mouth. Neck: No mass felt.  No neck rigidity. Respiratory: No rhonchi or crepitations. Cardiovascular: S1-S2 heard. Abdomen: Tenderness in the right upper quadrant. Musculoskeletal: No edema. Skin: No rash. Neurologic: Alert awake oriented time place and person.  Moves all extremities. Psychiatric: Appears normal per normal affect.   Labs on Admission: I have personally reviewed following labs and imaging studies  CBC: Recent Labs  Lab 01/04/20 2125  WBC 8.9  HGB 12.0  HCT 37.7  MCV 94.0  PLT 186   Basic Metabolic Panel: Recent Labs  Lab 01/04/20 2125  NA 139  K 3.8  CL 104  CO2 26  GLUCOSE 104*  BUN 9  CREATININE 0.56  CALCIUM 8.8*  GFR: CrCl cannot be calculated (Unknown ideal weight.). Liver Function Tests: Recent Labs  Lab 01/04/20 2125  AST 16  ALT 12  ALKPHOS 46  BILITOT 1.4*  PROT 6.7  ALBUMIN 4.4   Recent Labs  Lab 01/04/20 2125  LIPASE 32   No results for input(s): AMMONIA in the last 168 hours. Coagulation Profile: No results for input(s): INR, PROTIME in the last 168 hours. Cardiac Enzymes: No results for input(s): CKTOTAL, CKMB, CKMBINDEX, TROPONINI in the last 168 hours. BNP (last 3 results) No results for input(s): PROBNP in the last 8760 hours. HbA1C: No results for input(s):  HGBA1C in the last 72 hours. CBG: No results for input(s): GLUCAP in the last 168 hours. Lipid Profile: No results for input(s): CHOL, HDL, LDLCALC, TRIG, CHOLHDL, LDLDIRECT in the last 72 hours. Thyroid Function Tests: No results for input(s): TSH, T4TOTAL, FREET4, T3FREE, THYROIDAB in the last 72 hours. Anemia Panel: No results for input(s): VITAMINB12, FOLATE, FERRITIN, TIBC, IRON, RETICCTPCT in the last 72 hours. Urine analysis:    Component Value Date/Time   COLORURINE YELLOW 01/05/2020 0220   APPEARANCEUR CLEAR 01/05/2020 0220   LABSPEC 1.014 01/05/2020 0220   PHURINE 6.0 01/05/2020 0220   GLUCOSEU NEGATIVE 01/05/2020 0220   HGBUR NEGATIVE 01/05/2020 0220   BILIRUBINUR NEGATIVE 01/05/2020 0220   KETONESUR 20 (A) 01/05/2020 0220   PROTEINUR NEGATIVE 01/05/2020 0220   NITRITE NEGATIVE 01/05/2020 0220   LEUKOCYTESUR NEGATIVE 01/05/2020 0220   Sepsis Labs: @LABRCNTIP (procalcitonin:4,lacticidven:4) )No results found for this or any previous visit (from the past 240 hour(s)).   Radiological Exams on Admission: CT Angio Chest PE W and/or Wo Contrast  Result Date: 01/05/2020 CLINICAL DATA:  Right lower quadrant pain and abdominal distension, shortness of breath EXAM: CT ANGIOGRAPHY CHEST CT ABDOMEN AND PELVIS WITH CONTRAST TECHNIQUE: Multidetector CT imaging of the chest was performed using the standard protocol during bolus administration of intravenous contrast. Multiplanar CT image reconstructions and MIPs were obtained to evaluate the vascular anatomy. Multidetector CT imaging of the abdomen and pelvis was performed using the standard protocol during bolus administration of intravenous contrast. CONTRAST:  01/07/2020 OMNIPAQUE IOHEXOL 350 MG/ML SOLN COMPARISON:  Abdominal ultrasound 01/05/2020 FINDINGS: CTA CHEST FINDINGS Cardiovascular: There is borderline opacification of the pulmonary arteries and extensive respiratory motion artifact which limits evaluation beyond the central  pulmonary arteries. No large central filling defects are identified. Central pulmonary arteries are normal caliber. The aortic root is suboptimally assessed given cardiac pulsation artifact. The aorta is normal caliber. No intramural hematoma, dissection flap or other acute luminal abnormality of the aorta is seen. No periaortic stranding or hemorrhage. Mediastinum/Nodes: No mediastinal fluid or gas. Normal thyroid gland and thoracic inlet. No acute abnormality of the trachea or esophagus. No worrisome mediastinal, hilar or axillary adenopathy. Lungs/Pleura: No consolidation, features of edema, pneumothorax, or effusion. Atelectatic changes seen dependently. No pneumothorax or effusion. Musculoskeletal: Mild straightening of the thoracic kyphosis. No chest wall mass or suspicious bone lesions identified. Review of the MIP images confirms the above findings. CT ABDOMEN and PELVIS FINDINGS Hepatobiliary: No focal liver abnormality is seen. No gallstones, gallbladder wall thickening, or biliary dilatation. Some mild pericholecystic inflammatory change appears more focally centered upon the adjacent thickened colon. Pancreas: Unremarkable. No pancreatic ductal dilatation or surrounding inflammatory changes. Spleen: Normal in size without focal abnormality. Adrenals/Urinary Tract: Adrenal glands are unremarkable. Kidneys are normal, without renal calculi, focal lesion, or hydronephrosis. Bladder is unremarkable. Stomach/Bowel: Distal esophagus, stomach and duodenal sweep are unremarkable. No small bowel  wall thickening or dilatation. No evidence of obstruction. A normal appendix is visualized. There is a focally thickened segment of the distal ascending colon and hepatic flexure which appears centered upon a diverticular outpouching containing a large calcified fecalith (7/29) adjacent edematous and phlegmonous changes noted. Some reactive peritoneal thickening and enhancement is noted without discernible abscess or  collection at this time. More distal colonic segments have a normal appearance. Few scattered noninflamed distal colonic diverticular present. Vascular/Lymphatic: The aorta is normal caliber. Reactive adenopathy in the upper abdomen portacaval region. No pathologically enlarged lymph nodes in the abdomen or pelvis. Reproductive: Normal appearance of the uterus and adnexal structures. Other: Small volume low-attenuation fluid layering in the deep pelvis may reflect redistributed reactive free fluid. No free intraperitoneal air. No bowel containing hernias. Musculoskeletal: No acute osseous abnormality or suspicious osseous lesion. Review of the MIP images confirms the above findings. IMPRESSION: Findings of acute diverticulitis centered at distal ascending colon/hepatic flexure with associated pericolonic phlegmon, likely reactive free fluid and adjacent features of peritonitis. No extraluminal gas or organized collection or abscess is seen. Suboptimal evaluation of the pulmonary arteries due to extensive respiratory motion artifact. No large central filling defects are identified. No other acute intrathoracic or abdominopelvic process is seen. Electronically Signed   By: Lovena Le M.D.   On: 01/05/2020 03:26   CT ABDOMEN PELVIS W CONTRAST  Result Date: 01/05/2020 CLINICAL DATA:  Right lower quadrant pain and abdominal distension, shortness of breath EXAM: CT ANGIOGRAPHY CHEST CT ABDOMEN AND PELVIS WITH CONTRAST TECHNIQUE: Multidetector CT imaging of the chest was performed using the standard protocol during bolus administration of intravenous contrast. Multiplanar CT image reconstructions and MIPs were obtained to evaluate the vascular anatomy. Multidetector CT imaging of the abdomen and pelvis was performed using the standard protocol during bolus administration of intravenous contrast. CONTRAST:  135mL OMNIPAQUE IOHEXOL 350 MG/ML SOLN COMPARISON:  Abdominal ultrasound 01/05/2020 FINDINGS: CTA CHEST FINDINGS  Cardiovascular: There is borderline opacification of the pulmonary arteries and extensive respiratory motion artifact which limits evaluation beyond the central pulmonary arteries. No large central filling defects are identified. Central pulmonary arteries are normal caliber. The aortic root is suboptimally assessed given cardiac pulsation artifact. The aorta is normal caliber. No intramural hematoma, dissection flap or other acute luminal abnormality of the aorta is seen. No periaortic stranding or hemorrhage. Mediastinum/Nodes: No mediastinal fluid or gas. Normal thyroid gland and thoracic inlet. No acute abnormality of the trachea or esophagus. No worrisome mediastinal, hilar or axillary adenopathy. Lungs/Pleura: No consolidation, features of edema, pneumothorax, or effusion. Atelectatic changes seen dependently. No pneumothorax or effusion. Musculoskeletal: Mild straightening of the thoracic kyphosis. No chest wall mass or suspicious bone lesions identified. Review of the MIP images confirms the above findings. CT ABDOMEN and PELVIS FINDINGS Hepatobiliary: No focal liver abnormality is seen. No gallstones, gallbladder wall thickening, or biliary dilatation. Some mild pericholecystic inflammatory change appears more focally centered upon the adjacent thickened colon. Pancreas: Unremarkable. No pancreatic ductal dilatation or surrounding inflammatory changes. Spleen: Normal in size without focal abnormality. Adrenals/Urinary Tract: Adrenal glands are unremarkable. Kidneys are normal, without renal calculi, focal lesion, or hydronephrosis. Bladder is unremarkable. Stomach/Bowel: Distal esophagus, stomach and duodenal sweep are unremarkable. No small bowel wall thickening or dilatation. No evidence of obstruction. A normal appendix is visualized. There is a focally thickened segment of the distal ascending colon and hepatic flexure which appears centered upon a diverticular outpouching containing a large calcified  fecalith (7/29) adjacent edematous and phlegmonous changes  noted. Some reactive peritoneal thickening and enhancement is noted without discernible abscess or collection at this time. More distal colonic segments have a normal appearance. Few scattered noninflamed distal colonic diverticular present. Vascular/Lymphatic: The aorta is normal caliber. Reactive adenopathy in the upper abdomen portacaval region. No pathologically enlarged lymph nodes in the abdomen or pelvis. Reproductive: Normal appearance of the uterus and adnexal structures. Other: Small volume low-attenuation fluid layering in the deep pelvis may reflect redistributed reactive free fluid. No free intraperitoneal air. No bowel containing hernias. Musculoskeletal: No acute osseous abnormality or suspicious osseous lesion. Review of the MIP images confirms the above findings. IMPRESSION: Findings of acute diverticulitis centered at distal ascending colon/hepatic flexure with associated pericolonic phlegmon, likely reactive free fluid and adjacent features of peritonitis. No extraluminal gas or organized collection or abscess is seen. Suboptimal evaluation of the pulmonary arteries due to extensive respiratory motion artifact. No large central filling defects are identified. No other acute intrathoracic or abdominopelvic process is seen. Electronically Signed   By: Kreg Shropshire M.D.   On: 01/05/2020 03:26   US Abdomen Limited  Result Date: 01/05/2020 CLINICAL DATA:  34 year old female with right upper quadrant abdominal pain. EXAM: ULTRASOUND ABDOMEN LIMITED RIGHT UPPER QUADRANT COMPARISON:  None. FINDINGS: Gallbladder: No gallstones or wall thickening visualized. No sonographic Murphy sign noted by sonographer. Common bile duct: Diameter: 5 mm Liver: The liver is unremarkable. Portal vein is patent on color Doppler imaging with normal direction of blood flow towards the liver. Other: There is a trace subhepatic free fluid. IMPRESSION: Trace  subhepatic free fluid, otherwise unremarkable right upper quadrant ultrasound. Electronically Signed   By: Elgie Collard M.D.   On: 01/05/2020 02:16      Assessment/Plan Principal Problem:   Diverticulitis Active Problems:   Acute diverticulitis    1. Acute diverticulitis involving the right upper quadrant around the distal ascending colon around hepatic flexure.  As signs of peritonitis and phlegmon.  Dr. Carolynne Edouard on-call general surgeon has been consulted patient is on empiric biotic IV fluid pain really medication p.o.  Further recommendation per general surgery.  Covid test is pending.  Given that patient has significant findings for colitis with peritonitis changes will need inpatient status.   DVT prophylaxis: SCDs for now and to make sure no procedure anticipated. Code Status: Full code. Family Communication: Discussed with patient. Disposition Plan: Home. Consults called: General surgery. Admission status: Inpatient.   Eduard Clos MD [facility] Pager [phone].  If 7PM-7AM, please contact night-coverage [email or web address] Password [email or web address]  01/05/2020, 6:34 AM

## 2020-01-05 NOTE — Progress Notes (Signed)
Pharmacy Antibiotic Note  Andrea Lester is a 34 y.o. female admitted on 01/05/2020 with Intra-abdominal infection.  Pharmacy has been consulted for Zosyn dosing.  Plan: Zosyn 3.375g IV q8h (4 hour infusion).     Temp (24hrs), Avg:98.1 F (36.7 C), Min:98.1 F (36.7 C), Max:98.1 F (36.7 C)  Recent Labs  Lab 01/04/20 2125  WBC 8.9  CREATININE 0.56    CrCl cannot be calculated (Unknown ideal weight.).    Allergies  Allergen Reactions  . Sulfa Antibiotics Nausea And Vomiting    Antimicrobials this admission: Zosyn 01/05/2020 >>   Dose adjustments this admission: -  Microbiology results: -  Thank you for allowing pharmacy to be a part of this patient's care.  Aleene Davidson Crowford 01/05/2020 7:11 AM

## 2020-01-05 NOTE — ED Notes (Signed)
Pt to CT by stretcher with CT tech

## 2020-01-05 NOTE — Progress Notes (Signed)
34 year old female admitted with diverticulitis acute without abscess. She was admitted this morning by my partner. Continue n.p.o. IV fluids and IV antibiotics.

## 2020-01-05 NOTE — ED Provider Notes (Signed)
[facility]-EMERGENCY DEPT Provider Note   CSN: [id number] Arrival date & time: 01/04/20  2041     History Chief Complaint  Patient presents with  . Abdominal Pain    Andrea Lester is a 34 y.o. female with a hx of GERD, COVID (July 2020), anosmia presents to the Emergency Department complaining of gradual, persistent, progressively worsening RUQ abd pain onset 2 days ago.  Patient reports it has worsened significantly in the last 24 hours.  She reports movement, palpation and deep breathing increase her pain significantly.  She reports she feels some short of breath when she walks.  Nothing seems to make her symptoms better or worse.  She has a surgical history of cesarean section x2 and appendectomy.  Patient was evaluated this morning by [facility] PCP who referred her here to the emergency department for further work-up.  Patient reports she has felt hungry and has not had any nausea or vomiting.  She does report one episode of loose stool this morning and has been passing gas.  No history of bowel obstruction.  No fever, chills, headache, neck pain, weakness, dizziness, syncope, dysuria, vaginal bleeding.  Last menstrual cycle March 2.  The history is provided by the patient and medical records. No language interpreter was used.       Past Medical History:  Diagnosis Date  . GERD (gastroesophageal reflux disease)     Patient Active Problem List   Diagnosis Date Noted  . Dysphagia 11/11/2017    History reviewed. No pertinent surgical history.   OB History   No obstetric history on file.     History reviewed. No pertinent family history.  Social History   Tobacco Use  . Smoking status: Former Games developer  . Smokeless tobacco: Never Used  Substance Use Topics  . Alcohol use: Yes  . Drug use: No    Home Medications Prior to Admission medications   Medication Sig Start Date End Date Taking? Authorizing Provider  ibuprofen (ADVIL) 200 MG tablet Take 200 mg  by mouth every 6 (six) hours as needed for moderate pain.   Yes [provider]  Misc Natural Products (TURMERIC CURCUMIN) CAPS Take 1 capsule by mouth daily.   Yes [provider]  pseudoephedrine (SUDAFED) 30 MG tablet Take 30 mg by mouth every 4 (four) hours as needed for congestion.   Yes [provider]  predniSONE (DELTASONE) 10 MG tablet Take two tablets daily x 1 week, then one tablet daily x 1 week. Patient not taking: Reported on 01/05/2020 11/13/18   Jarold Motto, PA    Allergies    Sulfa antibiotics  Review of Systems   Review of Systems  Constitutional: Negative for appetite change, diaphoresis, fatigue, fever and unexpected weight change.  HENT: Negative for mouth sores.   Eyes: Negative for visual disturbance.  Respiratory: Positive for shortness of breath. Negative for cough, chest tightness and wheezing.   Cardiovascular: Negative for chest pain.  Gastrointestinal: Positive for abdominal pain and diarrhea. Negative for constipation, nausea and vomiting.  Endocrine: Negative for polydipsia, polyphagia and polyuria.  Genitourinary: Negative for dysuria, frequency, hematuria and urgency.  Musculoskeletal: Negative for back pain and neck stiffness.  Skin: Negative for rash.  Allergic/Immunologic: Negative for immunocompromised state.  Neurological: Negative for syncope, light-headedness and headaches.  Hematological: Does not bruise/bleed easily.  Psychiatric/Behavioral: Negative for sleep disturbance. The patient is not nervous/anxious.     Physical Exam Updated Vital Signs BP 129/83   Pulse 78   Temp  98.1 F (36.7 C) (Oral)   Resp 18   SpO2 99%   Physical Exam Vitals and nursing note reviewed.  Constitutional:      General: She is not in acute distress.    Appearance: She is not diaphoretic.  HENT:     Head: Normocephalic.  Eyes:     General: No scleral icterus.    Conjunctiva/sclera: Conjunctivae normal.  Cardiovascular:      Rate and Rhythm: Normal rate and regular rhythm.     Pulses: Normal pulses.          Radial pulses are 2+ on the right side and 2+ on the left side.  Pulmonary:     Effort: No tachypnea, accessory muscle usage, prolonged expiration, respiratory distress or retractions.     Breath sounds: No stridor.     Comments: Equal chest rise. No increased work of breathing. Abdominal:     General: There is no distension.     Palpations: Abdomen is soft.     Tenderness: There is abdominal tenderness in the right upper quadrant. There is guarding. There is no rebound. Positive signs include Murphy's sign.  Musculoskeletal:     Cervical back: Normal range of motion.     Comments: Moves all extremities equally and without difficulty.  Skin:    General: Skin is warm and dry.     Capillary Refill: Capillary refill takes less than 2 seconds.  Neurological:     Mental Status: She is alert.     GCS: GCS eye subscore is 4. GCS verbal subscore is 5. GCS motor subscore is 6.     Comments: Speech is clear and goal oriented.  Psychiatric:        Mood and Affect: Mood normal.     ED Results / Procedures / Treatments   Labs (all labs ordered are listed, but only abnormal results are displayed) Labs Reviewed  COMPREHENSIVE METABOLIC PANEL - Abnormal; Notable for the following components:      Result Value   Glucose, Bld 104 (*)    Calcium 8.8 (*)    Total Bilirubin 1.4 (*)    All other components within normal limits  URINALYSIS, ROUTINE W REFLEX MICROSCOPIC - Abnormal; Notable for the following components:   Ketones, ur 20 (*)    All other components within normal limits  SARS CORONAVIRUS 2 (TAT 6-24 HRS)  LIPASE, BLOOD  CBC  I-STAT BETA HCG BLOOD, ED ([facility], [facility], [facility] ONLY)    Radiology CT Angio Chest PE W and/or Wo Contrast  Result Date: 01/05/2020 CLINICAL DATA:  Right lower quadrant pain and abdominal distension, shortness of breath EXAM: CT ANGIOGRAPHY CHEST CT ABDOMEN AND PELVIS WITH CONTRAST  TECHNIQUE: Multidetector CT imaging of the chest was performed using the standard protocol during bolus administration of intravenous contrast. Multiplanar CT image reconstructions and MIPs were obtained to evaluate the vascular anatomy. Multidetector CT imaging of the abdomen and pelvis was performed using the standard protocol during bolus administration of intravenous contrast. CONTRAST:  100mL OMNIPAQUE IOHEXOL 350 MG/ML SOLN COMPARISON:  Abdominal ultrasound 01/05/2020 FINDINGS: CTA CHEST FINDINGS Cardiovascular: There is borderline opacification of the pulmonary arteries and extensive respiratory motion artifact which limits evaluation beyond the central pulmonary arteries. No large central filling defects are identified. Central pulmonary arteries are normal caliber. The aortic root is suboptimally assessed given cardiac pulsation artifact. The aorta is normal caliber. No intramural hematoma, dissection flap or other acute luminal abnormality of the aorta is seen. No periaortic stranding or  hemorrhage. Mediastinum/Nodes: No mediastinal fluid or gas. Normal thyroid gland and thoracic inlet. No acute abnormality of the trachea or esophagus. No worrisome mediastinal, hilar or axillary adenopathy. Lungs/Pleura: No consolidation, features of edema, pneumothorax, or effusion. Atelectatic changes seen dependently. No pneumothorax or effusion. Musculoskeletal: Mild straightening of the thoracic kyphosis. No chest wall mass or suspicious bone lesions identified. Review of the MIP images confirms the above findings. CT ABDOMEN and PELVIS FINDINGS Hepatobiliary: No focal liver abnormality is seen. No gallstones, gallbladder wall thickening, or biliary dilatation. Some mild pericholecystic inflammatory change appears more focally centered upon the adjacent thickened colon. Pancreas: Unremarkable. No pancreatic ductal dilatation or surrounding inflammatory changes. Spleen: Normal in size without focal abnormality.  Adrenals/Urinary Tract: Adrenal glands are unremarkable. Kidneys are normal, without renal calculi, focal lesion, or hydronephrosis. Bladder is unremarkable. Stomach/Bowel: Distal esophagus, stomach and duodenal sweep are unremarkable. No small bowel wall thickening or dilatation. No evidence of obstruction. A normal appendix is visualized. There is a focally thickened segment of the distal ascending colon and hepatic flexure which appears centered upon a diverticular outpouching containing a large calcified fecalith (7/29) adjacent edematous and phlegmonous changes noted. Some reactive peritoneal thickening and enhancement is noted without discernible abscess or collection at this time. More distal colonic segments have a normal appearance. Few scattered noninflamed distal colonic diverticular present. Vascular/Lymphatic: The aorta is normal caliber. Reactive adenopathy in the upper abdomen portacaval region. No pathologically enlarged lymph nodes in the abdomen or pelvis. Reproductive: Normal appearance of the uterus and adnexal structures. Other: Small volume low-attenuation fluid layering in the deep pelvis may reflect redistributed reactive free fluid. No free intraperitoneal air. No bowel containing hernias. Musculoskeletal: No acute osseous abnormality or suspicious osseous lesion. Review of the MIP images confirms the above findings. IMPRESSION: Findings of acute diverticulitis centered at distal ascending colon/hepatic flexure with associated pericolonic phlegmon, likely reactive free fluid and adjacent features of peritonitis. No extraluminal gas or organized collection or abscess is seen. Suboptimal evaluation of the pulmonary arteries due to extensive respiratory motion artifact. No large central filling defects are identified. No other acute intrathoracic or abdominopelvic process is seen. Electronically Signed   By: Kreg Shropshire M.D.   On: 01/05/2020 03:26   CT ABDOMEN PELVIS W CONTRAST  Result  Date: 01/05/2020 CLINICAL DATA:  Right lower quadrant pain and abdominal distension, shortness of breath EXAM: CT ANGIOGRAPHY CHEST CT ABDOMEN AND PELVIS WITH CONTRAST TECHNIQUE: Multidetector CT imaging of the chest was performed using the standard protocol during bolus administration of intravenous contrast. Multiplanar CT image reconstructions and MIPs were obtained to evaluate the vascular anatomy. Multidetector CT imaging of the abdomen and pelvis was performed using the standard protocol during bolus administration of intravenous contrast. CONTRAST:  OMNIPAQUE IOHEXOL 350 MG/ML SOLN COMPARISON:  Abdominal ultrasound 01/05/2020 FINDINGS: CTA CHEST FINDINGS Cardiovascular: There is borderline opacification of the pulmonary arteries and extensive respiratory motion artifact which limits evaluation beyond the central pulmonary arteries. No large central filling defects are identified. Central pulmonary arteries are normal caliber. The aortic root is suboptimally assessed given cardiac pulsation artifact. The aorta is normal caliber. No intramural hematoma, dissection flap or other acute luminal abnormality of the aorta is seen. No periaortic stranding or hemorrhage. Mediastinum/Nodes: No mediastinal fluid or gas. Normal thyroid gland and thoracic inlet. No acute abnormality of the trachea or esophagus. No worrisome mediastinal, hilar or axillary adenopathy. Lungs/Pleura: No consolidation, features of edema, pneumothorax, or effusion. Atelectatic changes seen dependently. No pneumothorax or effusion. Musculoskeletal:  Mild straightening of the thoracic kyphosis. No chest wall mass or suspicious bone lesions identified. Review of the MIP images confirms the above findings. CT ABDOMEN and PELVIS FINDINGS Hepatobiliary: No focal liver abnormality is seen. No gallstones, gallbladder wall thickening, or biliary dilatation. Some mild pericholecystic inflammatory change appears more focally centered upon the adjacent  thickened colon. Pancreas: Unremarkable. No pancreatic ductal dilatation or surrounding inflammatory changes. Spleen: Normal in size without focal abnormality. Adrenals/Urinary Tract: Adrenal glands are unremarkable. Kidneys are normal, without renal calculi, focal lesion, or hydronephrosis. Bladder is unremarkable. Stomach/Bowel: Distal esophagus, stomach and duodenal sweep are unremarkable. No small bowel wall thickening or dilatation. No evidence of obstruction. A normal appendix is visualized. There is a focally thickened segment of the distal ascending colon and hepatic flexure which appears centered upon a diverticular outpouching containing a large calcified fecalith (7/29) adjacent edematous and phlegmonous changes noted. Some reactive peritoneal thickening and enhancement is noted without discernible abscess or collection at this time. More distal colonic segments have a normal appearance. Few scattered noninflamed distal colonic diverticular present. Vascular/Lymphatic: The aorta is normal caliber. Reactive adenopathy in the upper abdomen portacaval region. No pathologically enlarged lymph nodes in the abdomen or pelvis. Reproductive: Normal appearance of the uterus and adnexal structures. Other: Small volume low-attenuation fluid layering in the deep pelvis may reflect redistributed reactive free fluid. No free intraperitoneal air. No bowel containing hernias. Musculoskeletal: No acute osseous abnormality or suspicious osseous lesion. Review of the MIP images confirms the above findings. IMPRESSION: Findings of acute diverticulitis centered at distal ascending colon/hepatic flexure with associated pericolonic phlegmon, likely reactive free fluid and adjacent features of peritonitis. No extraluminal gas or organized collection or abscess is seen. Suboptimal evaluation of the pulmonary arteries due to extensive respiratory motion artifact. No large central filling defects are identified. No other acute  intrathoracic or abdominopelvic process is seen. Electronically Signed   By: Lovena Le M.D.   On: 01/05/2020 03:26   US Abdomen Limited  Result Date: 01/05/2020 CLINICAL DATA:  34 year old female with right upper quadrant abdominal pain. EXAM: ULTRASOUND ABDOMEN LIMITED RIGHT UPPER QUADRANT COMPARISON:  None. FINDINGS: Gallbladder: No gallstones or wall thickening visualized. No sonographic Murphy sign noted by sonographer. Common bile duct: Diameter: 5 mm Liver: The liver is unremarkable. Portal vein is patent on color Doppler imaging with normal direction of blood flow towards the liver. Other: There is a trace subhepatic free fluid. IMPRESSION: Trace subhepatic free fluid, otherwise unremarkable right upper quadrant ultrasound. Electronically Signed   By: Anner Crete M.D.   On: 01/05/2020 02:16    Procedures Procedures (including critical care time)  Medications Ordered in ED Medications  ciprofloxacin (CIPRO) IVPB 400 mg (400 mg Intravenous New Bag/Given 01/05/20 0503)  metroNIDAZOLE (FLAGYL) IVPB 500 mg (has no administration in time range)  morphine 4 MG/ML injection 4 mg (4 mg Intravenous Given 01/05/20 0133)  ondansetron (ZOFRAN) injection 4 mg (4 mg Intravenous Given 01/05/20 0133)  iohexol (OMNIPAQUE) 350 MG/ML injection 100 mL (100 mLs Intravenous Contrast Given 01/05/20 0248)  sodium chloride (PF) 0.9 % injection (  Given by Other 01/05/20 0302)  morphine 4 MG/ML injection 4 mg (4 mg Intravenous Given 01/05/20 0458)    ED Course  I have reviewed the triage vital signs and the nursing notes.  Pertinent labs & imaging results that were available during my care of the patient were reviewed by me and considered in my medical decision making (see chart for details).  Clinical Course as  of Jan 04 525  Sat Jan 05, 2020  0300 Ultrasound without evidence of cholecystitis.  Patient continues to have severe abdominal pain.  Will obtain CT angio chest and abdomen for further  evaluation.   [HM]  0451 acute diverticulitis centered at distal ascending colon/hepatic flexure with associated pericolonic phlegmon  CT ABDOMEN PELVIS W CONTRAST [HM]  [phone] On repeat exam, patient continues to have severe abdominal pain with guarding and mild rebound.   [HM]    Clinical Course User Index [HM] Andrea Lester, Andrea Lester   MDM Rules/Calculators/A&P                       Patient with right upper quadrant abdominal pain.  Tenderness on exam.  Patient's pain is also pleuritic.  May be secondary to irritation of the diaphragm versus possible PE.  Patient is not taking an oral contraceptive, no recent travel, leg swelling or history of lupus/DVT.  Will obtain D-dimer and ultrasound for further evaluation.  Labs are reassuring without leukocytosis, elevation of AST/ALT or elevation in lipase.  Patient does have a slightly elevated total bili at 1.4.   4:52 AM CT scan shows diverticulitis with associated phlegmon.  No specific evidence of bowel perforation.  Given significant ongoing pain including rebound and guarding will admit for IV antibiotics and observation.  Patient is without fever, tachycardia or hypotension to suggest sepsis or severe sepsis.  Additional pain medications given.  Patient will be admitted.  Discussed patient's case with hospitalist, Dr. Toniann Fail.  I have recommended admission and patient (and family if present) agree with this plan. Admitting physician will place admission orders.   5:25 AM At Dr. Katherene Ponto request, neurosurgery was consulted.  Discussed with Dr. Carolynne Edouard.  He does not feel patient needs surgical intervention at this time.  He will contact Dr. Toniann Fail with any additional recommendations.   Final Clinical Impression(s) / ED Diagnoses Final diagnoses:  Right upper quadrant abdominal pain  Diverticulitis    Rx / DC Orders ED Discharge Orders    None       Andrea Lester 01/05/20 0526    Lester, Andrea Cower, MD  01/05/20 [phone]

## 2020-01-05 NOTE — Consult Note (Signed)
Reason for Consult:abd pain Referring Physician: Dr. Hazle Coca Minium is an 34 y.o. female.  HPI: The patient is a 34 year old white female who began having lower abdominal pain on Wednesday.  The pain has continued to worsen since then.  It seems to be located focally in the right lower quadrant.  She denies any fevers or chills.  She denies any nausea or vomiting.  She came to the emergency department where a CT scan showed diverticulitis of the right colon and a normal appendix.  There was no obvious abscess.  This seems to be her first episode of diverticulitis.  She is otherwise healthy.  Past Medical History:  Diagnosis Date  . GERD (gastroesophageal reflux disease)     Past Surgical History:  Procedure Laterality Date  . APPENDECTOMY    . CESAREAN SECTION      Family History  Problem Relation Age of Onset  . Crohn's disease Father     Social History:  reports that she has quit smoking. She has never used smokeless tobacco. She reports current alcohol use. She reports that she does not use drugs.  Allergies:  Allergies  Allergen Reactions  . Sulfa Antibiotics Nausea And Vomiting    Medications: I have reviewed the patient's current medications.  Results for orders placed or performed during the hospital encounter of 01/05/20 (from the past 48 hour(s))  Lipase, blood     Status: None   Collection Time: 01/04/20  9:25 PM  Result Value Ref Range   Lipase 32 11 - 51 U/L    Comment: Performed at [facility], [place] [address], [place], [place] [address]  Comprehensive metabolic panel     Status: Abnormal   Collection Time: 01/04/20  9:25 PM  Result Value Ref Range   Sodium 139 135 - 145 mmol/L   Potassium 3.8 3.5 - 5.1 mmol/L   Chloride 104 98 - 111 mmol/L   CO2 26 22 - 32 mmol/L   Glucose, Bld 104 (H) 70 - 99 mg/dL    Comment: Glucose reference range applies only to samples taken after fasting for at least 8 hours.   BUN 9 6 - 20 mg/dL    Creatinine, Ser 0.56 0.44 - 1.00 mg/dL   Calcium 8.8 (L) 8.9 - 10.3 mg/dL   Total Protein 6.7 6.5 - 8.1 g/dL   Albumin 4.4 3.5 - 5.0 g/dL   AST 16 15 - 41 U/L   ALT 12 0 - 44 U/L   Alkaline Phosphatase 46 38 - 126 U/L   Total Bilirubin 1.4 (H) 0.3 - 1.2 mg/dL   GFR calc non Af Amer >60 >60 mL/min   GFR calc Af Amer >60 >60 mL/min   Anion gap 9 5 - 15    Comment: Performed at [facility], [place] [address], [place], [place] [address]  CBC     Status: None   Collection Time: 01/04/20  9:25 PM  Result Value Ref Range   WBC 8.9 4.0 - 10.5 K/uL   RBC 4.01 3.87 - 5.11 MIL/uL   Hemoglobin 12.0 12.0 - 15.0 g/dL   HCT 37.7 36.0 - 46.0 %   MCV 94.0 80.0 - 100.0 fL   MCH 29.9 26.0 - 34.0 pg   MCHC 31.8 30.0 - 36.0 g/dL   RDW 11.9 11.5 - 15.5 %   Platelets 186 150 - 400 K/uL   nRBC 0.0 0.0 - 0.2 %    Comment: Performed at [facility], [address], [place], [place] [address]  I-Stat beta hCG blood, ED     Status: None   Collection Time: 01/04/20  9:32 PM  Result Value Ref Range   I-stat hCG, quantitative <5.0 <5 mIU/mL   Comment 3            Comment:   GEST. AGE      CONC.  (mIU/mL)   <=1 WEEK        5 - 50     2 WEEKS       50 - 500     3 WEEKS       100 - 10,000     4 WEEKS     1,000 - 30,000        FEMALE AND NON-PREGNANT FEMALE:     LESS THAN 5 mIU/mL   Urinalysis, Routine w reflex microscopic     Status: Abnormal   Collection Time: 01/05/20  2:20 AM  Result Value Ref Range   Color, Urine YELLOW YELLOW   APPearance CLEAR CLEAR   Specific Gravity, Urine 1.014 1.005 - 1.030   pH 6.0 5.0 - 8.0   Glucose, UA NEGATIVE NEGATIVE mg/dL   Hgb urine dipstick NEGATIVE NEGATIVE   Bilirubin Urine NEGATIVE NEGATIVE   Ketones, ur 20 (A) NEGATIVE mg/dL   Protein, ur NEGATIVE NEGATIVE mg/dL   Nitrite NEGATIVE NEGATIVE   Leukocytes,Ua NEGATIVE NEGATIVE    Comment: Performed at [facility], [address] [place], [place] [address]     CT Angio Chest PE W and/or Wo Contrast  Result Date: 01/05/2020 CLINICAL DATA:  Right lower quadrant pain and abdominal distension, shortness of breath EXAM: CT ANGIOGRAPHY CHEST CT ABDOMEN AND PELVIS WITH CONTRAST TECHNIQUE: Multidetector CT imaging of the chest was performed using the standard protocol during bolus administration of intravenous contrast. Multiplanar CT image reconstructions and MIPs were obtained to evaluate the vascular anatomy. Multidetector CT imaging of the abdomen and pelvis was performed using the standard protocol during bolus administration of intravenous contrast. CONTRAST:  OMNIPAQUE IOHEXOL 350 MG/ML SOLN COMPARISON:  Abdominal ultrasound 01/05/2020 FINDINGS: CTA CHEST FINDINGS Cardiovascular: There is borderline opacification of the pulmonary arteries and extensive respiratory motion artifact which limits evaluation beyond the central pulmonary arteries. No large central filling defects are identified. Central pulmonary arteries are normal caliber. The aortic root is suboptimally assessed given cardiac pulsation artifact. The aorta is normal caliber. No intramural hematoma, dissection flap or other acute luminal abnormality of the aorta is seen. No periaortic stranding or hemorrhage. Mediastinum/Nodes: No mediastinal fluid or gas. Normal thyroid gland and thoracic inlet. No acute abnormality of the trachea or esophagus. No worrisome mediastinal, hilar or axillary adenopathy. Lungs/Pleura: No consolidation, features of edema, pneumothorax, or effusion. Atelectatic changes seen dependently. No pneumothorax or effusion. Musculoskeletal: Mild straightening of the thoracic kyphosis. No chest wall mass or suspicious bone lesions identified. Review of the MIP images confirms the above findings. CT ABDOMEN and PELVIS FINDINGS Hepatobiliary: No focal liver abnormality is seen. No gallstones, gallbladder wall thickening, or biliary dilatation. Some mild pericholecystic inflammatory  change appears more focally centered upon the adjacent thickened colon. Pancreas: Unremarkable. No pancreatic ductal dilatation or surrounding inflammatory changes. Spleen: Normal in size without focal abnormality. Adrenals/Urinary Tract: Adrenal glands are unremarkable. Kidneys are normal, without renal calculi, focal lesion, or hydronephrosis. Bladder is unremarkable. Stomach/Bowel: Distal esophagus, stomach and duodenal sweep are unremarkable. No small bowel wall thickening or dilatation. No evidence of obstruction. A normal  appendix is visualized. There is a focally thickened segment of the distal ascending colon and hepatic flexure which appears centered upon a diverticular outpouching containing a large calcified fecalith (7/29) adjacent edematous and phlegmonous changes noted. Some reactive peritoneal thickening and enhancement is noted without discernible abscess or collection at this time. More distal colonic segments have a normal appearance. Few scattered noninflamed distal colonic diverticular present. Vascular/Lymphatic: The aorta is normal caliber. Reactive adenopathy in the upper abdomen portacaval region. No pathologically enlarged lymph nodes in the abdomen or pelvis. Reproductive: Normal appearance of the uterus and adnexal structures. Other: Small volume low-attenuation fluid layering in the deep pelvis may reflect redistributed reactive free fluid. No free intraperitoneal air. No bowel containing hernias. Musculoskeletal: No acute osseous abnormality or suspicious osseous lesion. Review of the MIP images confirms the above findings. IMPRESSION: Findings of acute diverticulitis centered at distal ascending colon/hepatic flexure with associated pericolonic phlegmon, likely reactive free fluid and adjacent features of peritonitis. No extraluminal gas or organized collection or abscess is seen. Suboptimal evaluation of the pulmonary arteries due to extensive respiratory motion artifact. No large  central filling defects are identified. No other acute intrathoracic or abdominopelvic process is seen. Electronically Signed   By: Kreg Shropshire M.D.   On: 01/05/2020 03:26   CT ABDOMEN PELVIS W CONTRAST  Result Date: 01/05/2020 CLINICAL DATA:  Right lower quadrant pain and abdominal distension, shortness of breath EXAM: CT ANGIOGRAPHY CHEST CT ABDOMEN AND PELVIS WITH CONTRAST TECHNIQUE: Multidetector CT imaging of the chest was performed using the standard protocol during bolus administration of intravenous contrast. Multiplanar CT image reconstructions and MIPs were obtained to evaluate the vascular anatomy. Multidetector CT imaging of the abdomen and pelvis was performed using the standard protocol during bolus administration of intravenous contrast. CONTRAST:  OMNIPAQUE IOHEXOL 350 MG/ML SOLN COMPARISON:  Abdominal ultrasound 01/05/2020 FINDINGS: CTA CHEST FINDINGS Cardiovascular: There is borderline opacification of the pulmonary arteries and extensive respiratory motion artifact which limits evaluation beyond the central pulmonary arteries. No large central filling defects are identified. Central pulmonary arteries are normal caliber. The aortic root is suboptimally assessed given cardiac pulsation artifact. The aorta is normal caliber. No intramural hematoma, dissection flap or other acute luminal abnormality of the aorta is seen. No periaortic stranding or hemorrhage. Mediastinum/Nodes: No mediastinal fluid or gas. Normal thyroid gland and thoracic inlet. No acute abnormality of the trachea or esophagus. No worrisome mediastinal, hilar or axillary adenopathy. Lungs/Pleura: No consolidation, features of edema, pneumothorax, or effusion. Atelectatic changes seen dependently. No pneumothorax or effusion. Musculoskeletal: Mild straightening of the thoracic kyphosis. No chest wall mass or suspicious bone lesions identified. Review of the MIP images confirms the above findings. CT ABDOMEN and PELVIS  FINDINGS Hepatobiliary: No focal liver abnormality is seen. No gallstones, gallbladder wall thickening, or biliary dilatation. Some mild pericholecystic inflammatory change appears more focally centered upon the adjacent thickened colon. Pancreas: Unremarkable. No pancreatic ductal dilatation or surrounding inflammatory changes. Spleen: Normal in size without focal abnormality. Adrenals/Urinary Tract: Adrenal glands are unremarkable. Kidneys are normal, without renal calculi, focal lesion, or hydronephrosis. Bladder is unremarkable. Stomach/Bowel: Distal esophagus, stomach and duodenal sweep are unremarkable. No small bowel wall thickening or dilatation. No evidence of obstruction. A normal appendix is visualized. There is a focally thickened segment of the distal ascending colon and hepatic flexure which appears centered upon a diverticular outpouching containing a large calcified fecalith (7/29) adjacent edematous and phlegmonous changes noted. Some reactive peritoneal thickening and enhancement is noted without discernible  abscess or collection at this time. More distal colonic segments have a normal appearance. Few scattered noninflamed distal colonic diverticular present. Vascular/Lymphatic: The aorta is normal caliber. Reactive adenopathy in the upper abdomen portacaval region. No pathologically enlarged lymph nodes in the abdomen or pelvis. Reproductive: Normal appearance of the uterus and adnexal structures. Other: Small volume low-attenuation fluid layering in the deep pelvis may reflect redistributed reactive free fluid. No free intraperitoneal air. No bowel containing hernias. Musculoskeletal: No acute osseous abnormality or suspicious osseous lesion. Review of the MIP images confirms the above findings. IMPRESSION: Findings of acute diverticulitis centered at distal ascending colon/hepatic flexure with associated pericolonic phlegmon, likely reactive free fluid and adjacent features of peritonitis. No  extraluminal gas or organized collection or abscess is seen. Suboptimal evaluation of the pulmonary arteries due to extensive respiratory motion artifact. No large central filling defects are identified. No other acute intrathoracic or abdominopelvic process is seen. Electronically Signed   By: Kreg ShropshirePrice  Andrea M.D.   On: 01/05/2020 03:26   US Abdomen Limited  Result Date: 01/05/2020 CLINICAL DATA:  34 year old female with right upper quadrant abdominal pain. EXAM: ULTRASOUND ABDOMEN LIMITED RIGHT UPPER QUADRANT COMPARISON:  None. FINDINGS: Gallbladder: No gallstones or wall thickening visualized. No sonographic Murphy sign noted by sonographer. Common bile duct: Diameter: 5 mm Liver: The liver is unremarkable. Portal vein is patent on color Doppler imaging with normal direction of blood flow towards the liver. Other: There is a trace subhepatic free fluid. IMPRESSION: Trace subhepatic free fluid, otherwise unremarkable right upper quadrant ultrasound. Electronically Signed   By: Elgie CollardArash  Radparvar M.D.   On: 01/05/2020 02:16    Review of Systems  Constitutional: Negative.  Negative for fever.  HENT: Negative.   Eyes: Negative.   Respiratory: Negative.   Cardiovascular: Negative.   Gastrointestinal: Positive for abdominal pain.  Endocrine: Negative.   Genitourinary: Negative.   Musculoskeletal: Negative.   Skin: Negative.   Allergic/Immunologic: Negative.   Neurological: Negative.   Hematological: Negative.   Psychiatric/Behavioral: Negative.    Blood pressure 102/67, pulse 90, temperature 98.1 F (36.7 C), temperature source Oral, resp. rate 16, SpO2 100 %. Physical Exam  Constitutional: She is oriented to person, place, and time. She appears well-developed and well-nourished. No distress.  HENT:  Head: Normocephalic and atraumatic.  Right Ear: External ear normal.  Left Ear: External ear normal.  Nose: Nose normal.  Mouth/Throat: Oropharynx is clear and moist.  Eyes: Pupils are equal,  round, and reactive to light. Conjunctivae and EOM are normal. No scleral icterus.  Neck: No thyromegaly present.  Cardiovascular: Normal rate, regular rhythm, normal heart sounds and intact distal pulses.  No pitting edema lower extr  Respiratory: Effort normal and breath sounds normal. No respiratory distress.  No use of accessory resp muscles  GI: Soft. Bowel sounds are normal.  There is focal right sided tenderness. No palpable mass or hernia  Musculoskeletal:        General: No deformity. Normal range of motion.     Cervical back: Normal range of motion and neck supple.     Comments: Normal gait  Lymphadenopathy:    She has no cervical adenopathy.  No palpable groin or cervical lymphadenopathy  Neurological: She is alert and oriented to person, place, and time.  Skin: Skin is warm and dry. No rash noted.  Psychiatric: She has a normal mood and affect. Her behavior is normal. Thought content normal.    Assessment/Plan: The patient appears to have diverticulitis of the right  colon without abscess.  At this point I would recommend bowel rest and broad-spectrum IV antibiotic therapy.  Once her pain improves then you could slowly start advancing her diet and then consider switching her to oral antibiotics.  She will need follow-up with gastroenterology after discharge for evaluation of her colon.  We will follow along with you.  She does not have an urgent indication for surgery at this point.  Chevis Pretty III 01/05/2020, 7:13 AM

## 2020-01-06 DIAGNOSIS — K5792 Diverticulitis of intestine, part unspecified, without perforation or abscess without bleeding: Secondary | ICD-10-CM

## 2020-01-06 DIAGNOSIS — K5732 Diverticulitis of large intestine without perforation or abscess without bleeding: Secondary | ICD-10-CM | POA: Diagnosis not present

## 2020-01-06 LAB — CBC WITH DIFFERENTIAL/PLATELET
Abs Immature Granulocytes: 0.02 10*3/uL (ref 0.00–0.07)
Basophils Absolute: 0 10*3/uL (ref 0.0–0.1)
Basophils Relative: 1 %
Eosinophils Absolute: 0.1 10*3/uL (ref 0.0–0.5)
Eosinophils Relative: 1 %
HCT: 38.3 % (ref 36.0–46.0)
Hemoglobin: 11.4 g/dL — ABNORMAL LOW (ref 12.0–15.0)
Immature Granulocytes: 0 %
Lymphocytes Relative: 13 %
Lymphs Abs: 0.8 10*3/uL (ref 0.7–4.0)
MCH: 30.1 pg (ref 26.0–34.0)
MCHC: 29.8 g/dL — ABNORMAL LOW (ref 30.0–36.0)
MCV: 101.1 fL — ABNORMAL HIGH (ref 80.0–100.0)
Monocytes Absolute: 0.4 10*3/uL (ref 0.1–1.0)
Monocytes Relative: 6 %
Neutro Abs: 4.9 10*3/uL (ref 1.7–7.7)
Neutrophils Relative %: 79 %
Platelets: 165 10*3/uL (ref 150–400)
RBC: 3.79 MIL/uL — ABNORMAL LOW (ref 3.87–5.11)
RDW: 11.9 % (ref 11.5–15.5)
WBC: 6.2 10*3/uL (ref 4.0–10.5)
nRBC: 0 % (ref 0.0–0.2)

## 2020-01-06 LAB — COMPREHENSIVE METABOLIC PANEL
ALT: 12 U/L (ref 0–44)
AST: 13 U/L — ABNORMAL LOW (ref 15–41)
Albumin: 3.7 g/dL (ref 3.5–5.0)
Alkaline Phosphatase: 42 U/L (ref 38–126)
Anion gap: 12 (ref 5–15)
BUN: 8 mg/dL (ref 6–20)
CO2: 16 mmol/L — ABNORMAL LOW (ref 22–32)
Calcium: 8.3 mg/dL — ABNORMAL LOW (ref 8.9–10.3)
Chloride: 110 mmol/L (ref 98–111)
Creatinine, Ser: 0.44 mg/dL (ref 0.44–1.00)
GFR calc Af Amer: >60 mL/min (ref >60)
GFR calc non Af Amer: >60 mL/min (ref >60)
Glucose, Bld: 75 mg/dL (ref 70–99)
Potassium: 4.5 mmol/L (ref 3.5–5.1)
Sodium: 138 mmol/L (ref 135–145)
Total Bilirubin: 0.7 mg/dL (ref 0.3–1.2)
Total Protein: 6 g/dL — ABNORMAL LOW (ref 6.5–8.1)

## 2020-01-06 LAB — GLUCOSE, CAPILLARY: Glucose-Capillary: 65 mg/dL — ABNORMAL LOW (ref 70–99)

## 2020-01-06 LAB — HIV ANTIBODY (ROUTINE TESTING W REFLEX): HIV Screen 4th Generation wRfx: NONREACTIVE

## 2020-01-06 MED ORDER — MORPHINE SULFATE 15 MG PO TABS
15.0000 mg | ORAL_TABLET | Freq: Once | ORAL | Status: AC
  Administered 2020-01-06: 15 mg via ORAL
  Filled 2020-01-06: qty 1

## 2020-01-06 NOTE — Progress Notes (Signed)
PROGRESS NOTE    Andrea Lester  YHC:[id number] DOB: [birth year] DOA: 01/05/2020 PCP: Andrea Barrack, MD    Brief Narrative: 34 y.o. female with no significant past medical history with family history of Crohn's disease presents to the ER with complaints of abdominal pain.  Patient's abdominal pain started last evening mostly right upper quadrant some nausea.  Earlier in the day patient had some diarrhea.  Last month patient also had some bloody stools which patient attributed to hemorrhoids.  Denies any fever chills.  Since pain was persistent patient came to the ER.  ED Course: In the ER patient had right upper quadrant tenderness and CT abdomen pelvis shows features concerning for distal ascending colitis with phlegmon and peritonitis changes.  Patient also had some pleuritic type of chest pain on deep breathing.  CT angiogram of the chest did not show any PE.  Labs are largely unremarkable.  Covid test is pending.  Dr. Marlou Lester on-call general surgeon has been consulted.  Assessment & Plan:   Principal Problem:   Diverticulitis Active Problems:   Acute diverticulitis    #1 acute diverticulitis without abscess-reports feeling better than yesterday however she had one episode of nausea and vomiting continues to have some abdominal pain. Surgery has started her on clear liquids we will keep her on clear liquids for today. Continue IV Zosyn. Out of bed ambulate. Follow-up with GI as an outpatient for colonoscopy.    Estimated body mass index is 25.06 kg/m as calculated from the following:   Height as of 11/13/18: 5\' 2"  (1.575 m).   Weight as of 11/13/18: 62.1 kg.  DVT prophylaxis: Lovenox  code Status: Full code  family Communication: Discussed with patient Disposition Plan: home once able to tolerate po  On iv antibiotics  Consultants:   surgery  Procedures:none Antimicrobials:zosyn  Subjective: Resting in bed in bed Feeling better than yesterday but still continues abdominal  pain had nausea and vomiting  Objective: Vitals:   01/05/20 0845 01/05/20 1402 01/05/20 2155 01/06/20 0518  BP: 108/67 104/66 116/72 104/64  Pulse: 75 65 74 60  Resp:  18 18 18   Temp: 98.1 F (36.7 C) 98.1 F (36.7 C) 97.8 F (36.6 C) (!) 97.5 F (36.4 C)  TempSrc: Oral Oral Oral Oral  SpO2: 100% 98% 100% 100%    Intake/Output Summary (Last 24 hours) at 01/06/2020 1118 Last data filed at 01/06/2020 0833 Gross per 24 hour  Intake 3104.39 ml  Output 1850 ml  Net 1254.39 ml   There were no vitals filed for this visit.  Examination:  General exam: Appears calm and comfortable  Respiratory system: Clear to auscultation. Respiratory effort normal. Cardiovascular system: S1 & S2 heard, RRR. No JVD, murmurs, rubs, gallops or clicks. No pedal edema. Gastrointestinal system: Abdomen is nondistended, soft and tender. No organomegaly or masses felt. Normal bowel sounds heard. Central nervous system: Alert and oriented. No focal neurological deficits. Extremities: Symmetric 5 x 5 power. Skin: No rashes, lesions or ulcers Psychiatry: Judgement and insight appear normal. Mood & affect appropriate.     Data Reviewed: I have personally reviewed following labs and imaging studies  CBC: Recent Labs  Lab 01/04/20 2125 01/06/20 0514  WBC 8.9 6.2  NEUTROABS  --  4.9  HGB 12.0 11.4*  HCT 37.7 38.3  MCV 94.0 101.1*  PLT 186 517   Basic Metabolic Panel: Recent Labs  Lab 01/04/20 2125 01/06/20 0514  NA 139 138  K 3.8 4.5  CL 104 110  CO2  26 16*  GLUCOSE 104* 75  BUN 9 8  CREATININE 0.56 0.44  CALCIUM 8.8* 8.3*   GFR: CrCl cannot be calculated (Unknown ideal weight.). Liver Function Tests: Recent Labs  Lab 01/04/20 2125 01/06/20 0514  AST 16 13*  ALT 12 12  ALKPHOS 46 42  BILITOT 1.4* 0.7  PROT 6.7 6.0*  ALBUMIN 4.4 3.7   Recent Labs  Lab 01/04/20 2125  LIPASE 32   No results for input(s): AMMONIA in the last 168 hours. Coagulation Profile: No results for  input(s): INR, PROTIME in the last 168 hours. Cardiac Enzymes: No results for input(s): CKTOTAL, CKMB, CKMBINDEX, TROPONINI in the last 168 hours. BNP (last 3 results) No results for input(s): PROBNP in the last 8760 hours. HbA1C: No results for input(s): HGBA1C in the last 72 hours. CBG: Recent Labs  Lab 01/06/20 0722  GLUCAP 65*   Lipid Profile: No results for input(s): CHOL, HDL, LDLCALC, TRIG, CHOLHDL, LDLDIRECT in the last 72 hours. Thyroid Function Tests: No results for input(s): TSH, T4TOTAL, FREET4, T3FREE, THYROIDAB in the last 72 hours. Anemia Panel: No results for input(s): VITAMINB12, FOLATE, FERRITIN, TIBC, IRON, RETICCTPCT in the last 72 hours. Sepsis Labs: No results for input(s): PROCALCITON, LATICACIDVEN in the last 168 hours.  Recent Results (from the past 240 hour(s))  SARS CORONAVIRUS 2 (TAT 6-24 HRS) Nasopharyngeal Nasopharyngeal Swab     Status: None   Collection Time: 01/05/20  5:00 AM   Specimen: Nasopharyngeal Swab  Result Value Ref Range Status   SARS Coronavirus 2 NEGATIVE NEGATIVE Final    Comment: (NOTE) SARS-CoV-2 target nucleic acids are NOT DETECTED. The SARS-CoV-2 RNA is generally detectable in upper and lower respiratory specimens during the acute phase of infection. Negative results do not preclude SARS-CoV-2 infection, do not rule out co-infections with other pathogens, and should not be used as the sole basis for treatment or other patient management decisions. Negative results must be combined with clinical observations, patient history, and epidemiological information. The expected result is Negative. Fact Sheet for Patients: HairSlick.no Fact Sheet for Healthcare Providers: quierodirigir.com This test is not yet approved or cleared by the [place] FDA and  has been authorized for detection and/or diagnosis of SARS-CoV-2 by FDA under an Emergency Use Authorization (EUA). This  EUA will remain  in effect (meaning this test can be used) for the duration of the COVID-19 declaration under Section 56 4(b)(1) of the Act, 21 U.S.C. section 360bbb-3(b)(1), unless the authorization is terminated or revoked sooner. Performed at [facility], [address], [place], [place] [address]          Radiology Studies: CT Angio Chest PE W and/or Wo Contrast  Result Date: 01/05/2020 CLINICAL DATA:  Right lower quadrant pain and abdominal distension, shortness of breath EXAM: CT ANGIOGRAPHY CHEST CT ABDOMEN AND PELVIS WITH CONTRAST TECHNIQUE: Multidetector CT imaging of the chest was performed using the standard protocol during bolus administration of intravenous contrast. Multiplanar CT image reconstructions and MIPs were obtained to evaluate the vascular anatomy. Multidetector CT imaging of the abdomen and pelvis was performed using the standard protocol during bolus administration of intravenous contrast. CONTRAST:  OMNIPAQUE IOHEXOL 350 MG/ML SOLN COMPARISON:  Abdominal ultrasound 01/05/2020 FINDINGS: CTA CHEST FINDINGS Cardiovascular: There is borderline opacification of the pulmonary arteries and extensive respiratory motion artifact which limits evaluation beyond the central pulmonary arteries. No large central filling defects are identified. Central pulmonary arteries are normal caliber. The aortic root is suboptimally assessed given cardiac  pulsation artifact. The aorta is normal caliber. No intramural hematoma, dissection flap or other acute luminal abnormality of the aorta is seen. No periaortic stranding or hemorrhage. Mediastinum/Nodes: No mediastinal fluid or gas. Normal thyroid gland and thoracic inlet. No acute abnormality of the trachea or esophagus. No worrisome mediastinal, hilar or axillary adenopathy. Lungs/Pleura: No consolidation, features of edema, pneumothorax, or effusion. Atelectatic changes seen dependently. No pneumothorax or effusion.  Musculoskeletal: Mild straightening of the thoracic kyphosis. No chest wall mass or suspicious bone lesions identified. Review of the MIP images confirms the above findings. CT ABDOMEN and PELVIS FINDINGS Hepatobiliary: No focal liver abnormality is seen. No gallstones, gallbladder wall thickening, or biliary dilatation. Some mild pericholecystic inflammatory change appears more focally centered upon the adjacent thickened colon. Pancreas: Unremarkable. No pancreatic ductal dilatation or surrounding inflammatory changes. Spleen: Normal in size without focal abnormality. Adrenals/Urinary Tract: Adrenal glands are unremarkable. Kidneys are normal, without renal calculi, focal lesion, or hydronephrosis. Bladder is unremarkable. Stomach/Bowel: Distal esophagus, stomach and duodenal sweep are unremarkable. No small bowel wall thickening or dilatation. No evidence of obstruction. A normal appendix is visualized. There is a focally thickened segment of the distal ascending colon and hepatic flexure which appears centered upon a diverticular outpouching containing a large calcified fecalith (7/29) adjacent edematous and phlegmonous changes noted. Some reactive peritoneal thickening and enhancement is noted without discernible abscess or collection at this time. More distal colonic segments have a normal appearance. Few scattered noninflamed distal colonic diverticular present. Vascular/Lymphatic: The aorta is normal caliber. Reactive adenopathy in the upper abdomen portacaval region. No pathologically enlarged lymph nodes in the abdomen or pelvis. Reproductive: Normal appearance of the uterus and adnexal structures. Other: Small volume low-attenuation fluid layering in the deep pelvis may reflect redistributed reactive free fluid. No free intraperitoneal air. No bowel containing hernias. Musculoskeletal: No acute osseous abnormality or suspicious osseous lesion. Review of the MIP images confirms the above findings.  IMPRESSION: Findings of acute diverticulitis centered at distal ascending colon/hepatic flexure with associated pericolonic phlegmon, likely reactive free fluid and adjacent features of peritonitis. No extraluminal gas or organized collection or abscess is seen. Suboptimal evaluation of the pulmonary arteries due to extensive respiratory motion artifact. No large central filling defects are identified. No other acute intrathoracic or abdominopelvic process is seen. Electronically Signed   By: Kreg Shropshire M.D.   On: 01/05/2020 03:26   CT ABDOMEN PELVIS W CONTRAST  Result Date: 01/05/2020 CLINICAL DATA:  Right lower quadrant pain and abdominal distension, shortness of breath EXAM: CT ANGIOGRAPHY CHEST CT ABDOMEN AND PELVIS WITH CONTRAST TECHNIQUE: Multidetector CT imaging of the chest was performed using the standard protocol during bolus administration of intravenous contrast. Multiplanar CT image reconstructions and MIPs were obtained to evaluate the vascular anatomy. Multidetector CT imaging of the abdomen and pelvis was performed using the standard protocol during bolus administration of intravenous contrast. CONTRAST:  OMNIPAQUE IOHEXOL 350 MG/ML SOLN COMPARISON:  Abdominal ultrasound 01/05/2020 FINDINGS: CTA CHEST FINDINGS Cardiovascular: There is borderline opacification of the pulmonary arteries and extensive respiratory motion artifact which limits evaluation beyond the central pulmonary arteries. No large central filling defects are identified. Central pulmonary arteries are normal caliber. The aortic root is suboptimally assessed given cardiac pulsation artifact. The aorta is normal caliber. No intramural hematoma, dissection flap or other acute luminal abnormality of the aorta is seen. No periaortic stranding or hemorrhage. Mediastinum/Nodes: No mediastinal fluid or gas. Normal thyroid gland and thoracic inlet. No acute abnormality of the trachea or  esophagus. No worrisome mediastinal, hilar or  axillary adenopathy. Lungs/Pleura: No consolidation, features of edema, pneumothorax, or effusion. Atelectatic changes seen dependently. No pneumothorax or effusion. Musculoskeletal: Mild straightening of the thoracic kyphosis. No chest wall mass or suspicious bone lesions identified. Review of the MIP images confirms the above findings. CT ABDOMEN and PELVIS FINDINGS Hepatobiliary: No focal liver abnormality is seen. No gallstones, gallbladder wall thickening, or biliary dilatation. Some mild pericholecystic inflammatory change appears more focally centered upon the adjacent thickened colon. Pancreas: Unremarkable. No pancreatic ductal dilatation or surrounding inflammatory changes. Spleen: Normal in size without focal abnormality. Adrenals/Urinary Tract: Adrenal glands are unremarkable. Kidneys are normal, without renal calculi, focal lesion, or hydronephrosis. Bladder is unremarkable. Stomach/Bowel: Distal esophagus, stomach and duodenal sweep are unremarkable. No small bowel wall thickening or dilatation. No evidence of obstruction. A normal appendix is visualized. There is a focally thickened segment of the distal ascending colon and hepatic flexure which appears centered upon a diverticular outpouching containing a large calcified fecalith (7/29) adjacent edematous and phlegmonous changes noted. Some reactive peritoneal thickening and enhancement is noted without discernible abscess or collection at this time. More distal colonic segments have a normal appearance. Few scattered noninflamed distal colonic diverticular present. Vascular/Lymphatic: The aorta is normal caliber. Reactive adenopathy in the upper abdomen portacaval region. No pathologically enlarged lymph nodes in the abdomen or pelvis. Reproductive: Normal appearance of the uterus and adnexal structures. Other: Small volume low-attenuation fluid layering in the deep pelvis may reflect redistributed reactive free fluid. No free intraperitoneal air.  No bowel containing hernias. Musculoskeletal: No acute osseous abnormality or suspicious osseous lesion. Review of the MIP images confirms the above findings. IMPRESSION: Findings of acute diverticulitis centered at distal ascending colon/hepatic flexure with associated pericolonic phlegmon, likely reactive free fluid and adjacent features of peritonitis. No extraluminal gas or organized collection or abscess is seen. Suboptimal evaluation of the pulmonary arteries due to extensive respiratory motion artifact. No large central filling defects are identified. No other acute intrathoracic or abdominopelvic process is seen. Electronically Signed   By: Kreg Shropshire M.D.   On: 01/05/2020 03:26   US Abdomen Limited  Result Date: 01/05/2020 CLINICAL DATA:  34 year old female with right upper quadrant abdominal pain. EXAM: ULTRASOUND ABDOMEN LIMITED RIGHT UPPER QUADRANT COMPARISON:  None. FINDINGS: Gallbladder: No gallstones or wall thickening visualized. No sonographic Murphy sign noted by sonographer. Common bile duct: Diameter: 5 mm Liver: The liver is unremarkable. Portal vein is patent on color Doppler imaging with normal direction of blood flow towards the liver. Other: There is a trace subhepatic free fluid. IMPRESSION: Trace subhepatic free fluid, otherwise unremarkable right upper quadrant ultrasound. Electronically Signed   By: Elgie Collard M.D.   On: 01/05/2020 02:16        Scheduled Meds: Continuous Infusions: . piperacillin-tazobactam (ZOSYN)  IV 3.375 g (01/06/20 0546)     LOS: 1 day     Alwyn Ren, MD 01/06/2020, 11:18 AM

## 2020-01-06 NOTE — Progress Notes (Signed)
Subjective/Chief Complaint: Some nausea this morning but pain is much improved   Objective: Vital signs in last 24 hours: Temp:  [97.5 F (36.4 C)-98.1 F (36.7 C)] 97.5 F (36.4 C) (03/14 0518) Pulse Rate:  [60-75] 60 (03/14 0518) Resp:  [18] 18 (03/14 0518) BP: (104-116)/(64-72) 104/64 (03/14 0518) SpO2:  [98 %-100 %] 100 % (03/14 0518) Last BM Date: 01/04/20  Intake/Output from previous day: 03/13 0701 - 03/14 0700 In: 3281.6 [P.O.:300; I.V.:2831.6; IV Piggyback:150] Out: 1850 [Urine:1650; Emesis/NG output:200] Intake/Output this shift: No intake/output data recorded.  Exam: Awake and alert Looks comfortable Abdomen soft with less tenderness and minimal guarding in the RUQ  Lab Results:  Recent Labs    01/04/20 2125 01/06/20 0514  WBC 8.9 6.2  HGB 12.0 11.4*  HCT 37.7 38.3  PLT 186 165   BMET Recent Labs    01/04/20 2125 01/06/20 0514  NA 139 138  K 3.8 4.5  CL 104 110  CO2 26 16*  GLUCOSE 104* 75  BUN 9 8  CREATININE 0.56 0.44  CALCIUM 8.8* 8.3*   PT/INR No results for input(s): LABPROT, INR in the last 72 hours. ABG No results for input(s): PHART, HCO3 in the last 72 hours.  Invalid input(s): PCO2, PO2  Studies/Results: CT Angio Chest PE W and/or Wo Contrast  Result Date: 01/05/2020 CLINICAL DATA:  Right lower quadrant pain and abdominal distension, shortness of breath EXAM: CT ANGIOGRAPHY CHEST CT ABDOMEN AND PELVIS WITH CONTRAST TECHNIQUE: Multidetector CT imaging of the chest was performed using the standard protocol during bolus administration of intravenous contrast. Multiplanar CT image reconstructions and MIPs were obtained to evaluate the vascular anatomy. Multidetector CT imaging of the abdomen and pelvis was performed using the standard protocol during bolus administration of intravenous contrast. CONTRAST:  OMNIPAQUE IOHEXOL 350 MG/ML SOLN COMPARISON:  Abdominal ultrasound 01/05/2020 FINDINGS: CTA CHEST FINDINGS Cardiovascular:  There is borderline opacification of the pulmonary arteries and extensive respiratory motion artifact which limits evaluation beyond the central pulmonary arteries. No large central filling defects are identified. Central pulmonary arteries are normal caliber. The aortic root is suboptimally assessed given cardiac pulsation artifact. The aorta is normal caliber. No intramural hematoma, dissection flap or other acute luminal abnormality of the aorta is seen. No periaortic stranding or hemorrhage. Mediastinum/Nodes: No mediastinal fluid or gas. Normal thyroid gland and thoracic inlet. No acute abnormality of the trachea or esophagus. No worrisome mediastinal, hilar or axillary adenopathy. Lungs/Pleura: No consolidation, features of edema, pneumothorax, or effusion. Atelectatic changes seen dependently. No pneumothorax or effusion. Musculoskeletal: Mild straightening of the thoracic kyphosis. No chest wall mass or suspicious bone lesions identified. Review of the MIP images confirms the above findings. CT ABDOMEN and PELVIS FINDINGS Hepatobiliary: No focal liver abnormality is seen. No gallstones, gallbladder wall thickening, or biliary dilatation. Some mild pericholecystic inflammatory change appears more focally centered upon the adjacent thickened colon. Pancreas: Unremarkable. No pancreatic ductal dilatation or surrounding inflammatory changes. Spleen: Normal in size without focal abnormality. Adrenals/Urinary Tract: Adrenal glands are unremarkable. Kidneys are normal, without renal calculi, focal lesion, or hydronephrosis. Bladder is unremarkable. Stomach/Bowel: Distal esophagus, stomach and duodenal sweep are unremarkable. No small bowel wall thickening or dilatation. No evidence of obstruction. A normal appendix is visualized. There is a focally thickened segment of the distal ascending colon and hepatic flexure which appears centered upon a diverticular outpouching containing a large calcified fecalith (7/29)  adjacent edematous and phlegmonous changes noted. Some reactive peritoneal thickening and enhancement is noted  without discernible abscess or collection at this time. More distal colonic segments have a normal appearance. Few scattered noninflamed distal colonic diverticular present. Vascular/Lymphatic: The aorta is normal caliber. Reactive adenopathy in the upper abdomen portacaval region. No pathologically enlarged lymph nodes in the abdomen or pelvis. Reproductive: Normal appearance of the uterus and adnexal structures. Other: Small volume low-attenuation fluid layering in the deep pelvis may reflect redistributed reactive free fluid. No free intraperitoneal air. No bowel containing hernias. Musculoskeletal: No acute osseous abnormality or suspicious osseous lesion. Review of the MIP images confirms the above findings. IMPRESSION: Findings of acute diverticulitis centered at distal ascending colon/hepatic flexure with associated pericolonic phlegmon, likely reactive free fluid and adjacent features of peritonitis. No extraluminal gas or organized collection or abscess is seen. Suboptimal evaluation of the pulmonary arteries due to extensive respiratory motion artifact. No large central filling defects are identified. No other acute intrathoracic or abdominopelvic process is seen. Electronically Signed   By: Lovena Le M.D.   On: 01/05/2020 03:26   CT ABDOMEN PELVIS W CONTRAST  Result Date: 01/05/2020 CLINICAL DATA:  Right lower quadrant pain and abdominal distension, shortness of breath EXAM: CT ANGIOGRAPHY CHEST CT ABDOMEN AND PELVIS WITH CONTRAST TECHNIQUE: Multidetector CT imaging of the chest was performed using the standard protocol during bolus administration of intravenous contrast. Multiplanar CT image reconstructions and MIPs were obtained to evaluate the vascular anatomy. Multidetector CT imaging of the abdomen and pelvis was performed using the standard protocol during bolus administration of  intravenous contrast. CONTRAST:  159mL OMNIPAQUE IOHEXOL 350 MG/ML SOLN COMPARISON:  Abdominal ultrasound 01/05/2020 FINDINGS: CTA CHEST FINDINGS Cardiovascular: There is borderline opacification of the pulmonary arteries and extensive respiratory motion artifact which limits evaluation beyond the central pulmonary arteries. No large central filling defects are identified. Central pulmonary arteries are normal caliber. The aortic root is suboptimally assessed given cardiac pulsation artifact. The aorta is normal caliber. No intramural hematoma, dissection flap or other acute luminal abnormality of the aorta is seen. No periaortic stranding or hemorrhage. Mediastinum/Nodes: No mediastinal fluid or gas. Normal thyroid gland and thoracic inlet. No acute abnormality of the trachea or esophagus. No worrisome mediastinal, hilar or axillary adenopathy. Lungs/Pleura: No consolidation, features of edema, pneumothorax, or effusion. Atelectatic changes seen dependently. No pneumothorax or effusion. Musculoskeletal: Mild straightening of the thoracic kyphosis. No chest wall mass or suspicious bone lesions identified. Review of the MIP images confirms the above findings. CT ABDOMEN and PELVIS FINDINGS Hepatobiliary: No focal liver abnormality is seen. No gallstones, gallbladder wall thickening, or biliary dilatation. Some mild pericholecystic inflammatory change appears more focally centered upon the adjacent thickened colon. Pancreas: Unremarkable. No pancreatic ductal dilatation or surrounding inflammatory changes. Spleen: Normal in size without focal abnormality. Adrenals/Urinary Tract: Adrenal glands are unremarkable. Kidneys are normal, without renal calculi, focal lesion, or hydronephrosis. Bladder is unremarkable. Stomach/Bowel: Distal esophagus, stomach and duodenal sweep are unremarkable. No small bowel wall thickening or dilatation. No evidence of obstruction. A normal appendix is visualized. There is a focally  thickened segment of the distal ascending colon and hepatic flexure which appears centered upon a diverticular outpouching containing a large calcified fecalith (7/29) adjacent edematous and phlegmonous changes noted. Some reactive peritoneal thickening and enhancement is noted without discernible abscess or collection at this time. More distal colonic segments have a normal appearance. Few scattered noninflamed distal colonic diverticular present. Vascular/Lymphatic: The aorta is normal caliber. Reactive adenopathy in the upper abdomen portacaval region. No pathologically enlarged lymph nodes in the abdomen or  pelvis. Reproductive: Normal appearance of the uterus and adnexal structures. Other: Small volume low-attenuation fluid layering in the deep pelvis may reflect redistributed reactive free fluid. No free intraperitoneal air. No bowel containing hernias. Musculoskeletal: No acute osseous abnormality or suspicious osseous lesion. Review of the MIP images confirms the above findings. IMPRESSION: Findings of acute diverticulitis centered at distal ascending colon/hepatic flexure with associated pericolonic phlegmon, likely reactive free fluid and adjacent features of peritonitis. No extraluminal gas or organized collection or abscess is seen. Suboptimal evaluation of the pulmonary arteries due to extensive respiratory motion artifact. No large central filling defects are identified. No other acute intrathoracic or abdominopelvic process is seen. Electronically Signed   By: Kreg Shropshire M.D.   On: 01/05/2020 03:26   US Abdomen Limited  Result Date: 01/05/2020 CLINICAL DATA:  34 year old female with right upper quadrant abdominal pain. EXAM: ULTRASOUND ABDOMEN LIMITED RIGHT UPPER QUADRANT COMPARISON:  None. FINDINGS: Gallbladder: No gallstones or wall thickening visualized. No sonographic Murphy sign noted by sonographer. Common bile duct: Diameter: 5 mm Liver: The liver is unremarkable. Portal vein is patent on  color Doppler imaging with normal direction of blood flow towards the liver. Other: There is a trace subhepatic free fluid. IMPRESSION: Trace subhepatic free fluid, otherwise unremarkable right upper quadrant ultrasound. Electronically Signed   By: Elgie Collard M.D.   On: 01/05/2020 02:16    Anti-infectives: Anti-infectives (From admission, onward)   Start     Dose/Rate Route Frequency Ordered Stop   01/05/20 1400  piperacillin-tazobactam (ZOSYN) IVPB 3.375 g     3.375 g 12.5 mL/hr over 240 Minutes Intravenous Every 8 hours 01/05/20 0634     01/05/20 0500  ciprofloxacin (CIPRO) IVPB 400 mg     400 mg 200 mL/hr over 60 Minutes Intravenous  Once 01/05/20 0451 01/05/20 0603   01/05/20 0500  metroNIDAZOLE (FLAGYL) IVPB 500 mg     500 mg 100 mL/hr over 60 Minutes Intravenous  Once 01/05/20 0451 01/05/20 0732      Assessment/Plan: Ascending colon diverticulitis  Improving on IV antibiotics WBC now normal Given phlegmon, would continue IV antibiotics until at least tomorrow Will start liquids Will need eventual outpt referral to GI for colonoscopy  LOS: 1 day    Andrea Lester 01/06/2020

## 2020-01-07 MED ORDER — ONDANSETRON HCL 4 MG PO TABS: 4.0000 mg | ORAL_TABLET | Freq: Four times a day (QID) | ORAL | 0 refills | Status: AC | PRN

## 2020-01-07 MED ORDER — AMOXICILLIN-POT CLAVULANATE 875-125 MG PO TABS: 1.0000 | ORAL_TABLET | Freq: Two times a day (BID) | ORAL | 0 refills | Status: AC

## 2020-01-07 MED ORDER — AMOXICILLIN-POT CLAVULANATE 875-125 MG PO TABS
1.0000 | ORAL_TABLET | Freq: Two times a day (BID) | ORAL | Status: DC
  Administered 2020-01-07: 1 via ORAL
  Filled 2020-01-07: qty 1

## 2020-01-07 NOTE — Progress Notes (Signed)
    CC: Abdominal pain  Subjective: Patient continues to improve.  She still has a little bit of pain on the right side.  Its not severe.  No flatus or BM so far.  She is only been in the halls to walk x1.  Objective: Vital signs in last 24 hours: Temp:  [97.5 F (36.4 C)-98.4 F (36.9 C)] 97.5 F (36.4 C) (03/15 0539) Pulse Rate:  [51-59] 57 (03/15 0539) Resp:  [16-18] 16 (03/15 0539) BP: (105-114)/(65-75) 114/75 (03/15 0539) SpO2:  [100 %] 100 % (03/15 0539) Last BM Date: 01/04/20 1080 p.o. recorded 57 cc IV recorded Urine 1500 No emesis/stool Afebrile vital signs are stable WBC 8.9>>6.2(3/14) CMP stable (3/14) CT 3/13: Acute diverticulitis centered at the distal ascending colon hepatic flexure with associated pericolonic phlegmon reactive free fluid and adjacent features of peritonitis no extraluminal gas or organized abscess seen. Intake/Output from previous day: 03/14 0701 - 03/15 0700 In: 1136.9 [P.O.:1080; IV Piggyback:56.9] Out: 1500 [Urine:1500] Intake/Output this shift: No intake/output data recorded.  General appearance: alert, cooperative and no distress Resp: clear to auscultation bilaterally GI: Soft, minimal tenderness right lower quadrant.  Positive bowel sounds, no flatus or BM.  Lab Results:  Recent Labs    01/04/20 2125 01/06/20 0514  WBC 8.9 6.2  HGB 12.0 11.4*  HCT 37.7 38.3  PLT 186 165    BMET Recent Labs    01/04/20 2125 01/06/20 0514  NA 139 138  K 3.8 4.5  CL 104 110  CO2 26 16*  GLUCOSE 104* 75  BUN 9 8  CREATININE 0.56 0.44  CALCIUM 8.8* 8.3*   PT/INR No results for input(s): LABPROT, INR in the last 72 hours.  Recent Labs  Lab 01/04/20 2125 01/06/20 0514  AST 16 13*  ALT 12 12  ALKPHOS 46 42  BILITOT 1.4* 0.7  PROT 6.7 6.0*  ALBUMIN 4.4 3.7     Lipase     Component Value Date/Time   LIPASE 32 01/04/2020 2125     Medications:   . piperacillin-tazobactam (ZOSYN)  IV 3.375 g (01/07/20 0526)     Assessment/Plan   Right: Diverticulitis without abscess  FEN: Clear liquids ID: Cipro/Flagyl 3/13 x 1; Zosyn 3/13 >> day 3 DVT: SCDs only -she can have chemical DVT prophylaxis from our standpoint Follow-up: TBD  Plan: Mobilize, advance to full liquids and see how she does.      LOS: 2 days    Rian Koon 01/07/2020 Please see Amion

## 2020-01-07 NOTE — Progress Notes (Signed)
Discharge instructions given to pt and all questions were answered.  

## 2020-01-07 NOTE — Discharge Summary (Signed)
Physician Discharge Summary  Andrea Lester BWI:[id number] DOB: 1986-01-22 DOA: 01/05/2020  PCP: Andrea Dark, MD  Admit date: 01/05/2020 Discharge date: 01/07/2020  Admitted From: Home Disposition: Home Recommendations for Outpatient Follow-up:  1. Follow up with PCP in 1-2 weeks 2. Please obtain BMP/CBC in one week 3. Please follow up with GI in 6 to 8 weeks for colonoscopy  Home Health none Equipment/Devices: None Discharge Condition stable and improved CODE STATUS: Full code Diet recommendation: Regular diet Brief/Interim Summary:34 y.o.femalewithno significant past medical history with family history of Crohn's disease presents to the ER with complaints of abdominal pain. Patient's abdominal pain started last evening mostly right upper quadrant some nausea. Earlier in the day patient had some diarrhea. Last month patient also had some bloody stools which patient attributed to hemorrhoids. Denies any fever chills. Since pain was persistent patient came to the ER.  ED Course:In the ER patient had right upper quadrant tenderness and CT abdomen pelvis shows features concerning for distal ascending colitis with phlegmon and peritonitis changes. Patient also had some pleuritic type of chest pain on deep breathing. CT angiogram of the chest did not show any PE. Labs are largely unremarkable. Covid test is pending. Dr. Carolynne Edouard on-call general surgeon has been consulted.   Discharge Diagnoses:  Principal Problem:   Diverticulitis Active Problems:   Acute diverticulitis   #1 acute diverticulitis without abscess-reports feeling better than yesterday she is tolerating p.o. intake.  She is anxious to go home.  We will discharge her on Augmentin for 10 days.  She will follow up with GI for an outpatient colonoscopy.  She was treated with IV Zosyn during the hospital stay. Estimated body mass index is 25.06 kg/m as calculated from the following:   Height as of this encounter: 5\' 2"   (1.575 m).   Weight as of 11/13/18: 62.1 kg.  Discharge Instructions   Allergies as of 01/07/2020      Reactions   Sulfa Antibiotics Nausea And Vomiting      Medication List    STOP taking these medications   ibuprofen 200 MG tablet Commonly known as: ADVIL   predniSONE 10 MG tablet Commonly known as: DELTASONE   pseudoephedrine 30 MG tablet Commonly known as: SUDAFED     TAKE these medications   amoxicillin-clavulanate 875-125 MG tablet Commonly known as: AUGMENTIN Take 1 tablet by mouth 2 (two) times daily for 10 days.   ondansetron 4 MG tablet Commonly known as: ZOFRAN Take 1 tablet (4 mg total) by mouth every 6 (six) hours as needed for nausea.   Turmeric Curcumin Caps Take 1 capsule by mouth daily.      Follow-up Information    01/09/2020, MD Follow up.   Specialty: Family Medicine Contact information: [address] [place] [phone]        [phone], MD Follow up.   Specialty: Gastroenterology Contact information: [address], [address] [place] [address]          Allergies  Allergen Reactions  . Sulfa Antibiotics Nausea And Vomiting    Consultations:  General surgery   Procedures/Studies: CT Angio Chest PE W and/or Wo Contrast  Result Date: 01/05/2020 CLINICAL DATA:  Right lower quadrant pain and abdominal distension, shortness of breath EXAM: CT ANGIOGRAPHY CHEST CT ABDOMEN AND PELVIS WITH CONTRAST TECHNIQUE: Multidetector CT imaging of the chest was performed using the standard protocol during bolus administration of intravenous contrast. Multiplanar CT image reconstructions and MIPs were obtained  to evaluate the vascular anatomy. Multidetector CT imaging of the abdomen and pelvis was performed using the standard protocol during bolus administration of intravenous contrast. CONTRAST:  100mL OMNIPAQUE IOHEXOL 350 MG/ML SOLN COMPARISON:  Abdominal ultrasound 01/05/2020 FINDINGS: CTA CHEST  FINDINGS Cardiovascular: There is borderline opacification of the pulmonary arteries and extensive respiratory motion artifact which limits evaluation beyond the central pulmonary arteries. No large central filling defects are identified. Central pulmonary arteries are normal caliber. The aortic root is suboptimally assessed given cardiac pulsation artifact. The aorta is normal caliber. No intramural hematoma, dissection flap or other acute luminal abnormality of the aorta is seen. No periaortic stranding or hemorrhage. Mediastinum/Nodes: No mediastinal fluid or gas. Normal thyroid gland and thoracic inlet. No acute abnormality of the trachea or esophagus. No worrisome mediastinal, hilar or axillary adenopathy. Lungs/Pleura: No consolidation, features of edema, pneumothorax, or effusion. Atelectatic changes seen dependently. No pneumothorax or effusion. Musculoskeletal: Mild straightening of the thoracic kyphosis. No chest wall mass or suspicious bone lesions identified. Review of the MIP images confirms the above findings. CT ABDOMEN and PELVIS FINDINGS Hepatobiliary: No focal liver abnormality is seen. No gallstones, gallbladder wall thickening, or biliary dilatation. Some mild pericholecystic inflammatory change appears more focally centered upon the adjacent thickened colon. Pancreas: Unremarkable. No pancreatic ductal dilatation or surrounding inflammatory changes. Spleen: Normal in size without focal abnormality. Adrenals/Urinary Tract: Adrenal glands are unremarkable. Kidneys are normal, without renal calculi, focal lesion, or hydronephrosis. Bladder is unremarkable. Stomach/Bowel: Distal esophagus, stomach and duodenal sweep are unremarkable. No small bowel wall thickening or dilatation. No evidence of obstruction. A normal appendix is visualized. There is a focally thickened segment of the distal ascending colon and hepatic flexure which appears centered upon a diverticular outpouching containing a large  calcified fecalith (7/29) adjacent edematous and phlegmonous changes noted. Some reactive peritoneal thickening and enhancement is noted without discernible abscess or collection at this time. More distal colonic segments have a normal appearance. Few scattered noninflamed distal colonic diverticular present. Vascular/Lymphatic: The aorta is normal caliber. Reactive adenopathy in the upper abdomen portacaval region. No pathologically enlarged lymph nodes in the abdomen or pelvis. Reproductive: Normal appearance of the uterus and adnexal structures. Other: Small volume low-attenuation fluid layering in the deep pelvis may reflect redistributed reactive free fluid. No free intraperitoneal air. No bowel containing hernias. Musculoskeletal: No acute osseous abnormality or suspicious osseous lesion. Review of the MIP images confirms the above findings. IMPRESSION: Findings of acute diverticulitis centered at distal ascending colon/hepatic flexure with associated pericolonic phlegmon, likely reactive free fluid and adjacent features of peritonitis. No extraluminal gas or organized collection or abscess is seen. Suboptimal evaluation of the pulmonary arteries due to extensive respiratory motion artifact. No large central filling defects are identified. No other acute intrathoracic or abdominopelvic process is seen. Electronically Signed   By: Kreg ShropshirePrice  DeHay M.D.   On: 01/05/2020 03:26   CT ABDOMEN PELVIS W CONTRAST  Result Date: 01/05/2020 CLINICAL DATA:  Right lower quadrant pain and abdominal distension, shortness of breath EXAM: CT ANGIOGRAPHY CHEST CT ABDOMEN AND PELVIS WITH CONTRAST TECHNIQUE: Multidetector CT imaging of the chest was performed using the standard protocol during bolus administration of intravenous contrast. Multiplanar CT image reconstructions and MIPs were obtained to evaluate the vascular anatomy. Multidetector CT imaging of the abdomen and pelvis was performed using the standard protocol during  bolus administration of intravenous contrast. CONTRAST:  100mL OMNIPAQUE IOHEXOL 350 MG/ML SOLN COMPARISON:  Abdominal ultrasound 01/05/2020 FINDINGS: CTA CHEST FINDINGS Cardiovascular: There is  borderline opacification of the pulmonary arteries and extensive respiratory motion artifact which limits evaluation beyond the central pulmonary arteries. No large central filling defects are identified. Central pulmonary arteries are normal caliber. The aortic root is suboptimally assessed given cardiac pulsation artifact. The aorta is normal caliber. No intramural hematoma, dissection flap or other acute luminal abnormality of the aorta is seen. No periaortic stranding or hemorrhage. Mediastinum/Nodes: No mediastinal fluid or gas. Normal thyroid gland and thoracic inlet. No acute abnormality of the trachea or esophagus. No worrisome mediastinal, hilar or axillary adenopathy. Lungs/Pleura: No consolidation, features of edema, pneumothorax, or effusion. Atelectatic changes seen dependently. No pneumothorax or effusion. Musculoskeletal: Mild straightening of the thoracic kyphosis. No chest wall mass or suspicious bone lesions identified. Review of the MIP images confirms the above findings. CT ABDOMEN and PELVIS FINDINGS Hepatobiliary: No focal liver abnormality is seen. No gallstones, gallbladder wall thickening, or biliary dilatation. Some mild pericholecystic inflammatory change appears more focally centered upon the adjacent thickened colon. Pancreas: Unremarkable. No pancreatic ductal dilatation or surrounding inflammatory changes. Spleen: Normal in size without focal abnormality. Adrenals/Urinary Tract: Adrenal glands are unremarkable. Kidneys are normal, without renal calculi, focal lesion, or hydronephrosis. Bladder is unremarkable. Stomach/Bowel: Distal esophagus, stomach and duodenal sweep are unremarkable. No small bowel wall thickening or dilatation. No evidence of obstruction. A normal appendix is visualized.  There is a focally thickened segment of the distal ascending colon and hepatic flexure which appears centered upon a diverticular outpouching containing a large calcified fecalith (7/29) adjacent edematous and phlegmonous changes noted. Some reactive peritoneal thickening and enhancement is noted without discernible abscess or collection at this time. More distal colonic segments have a normal appearance. Few scattered noninflamed distal colonic diverticular present. Vascular/Lymphatic: The aorta is normal caliber. Reactive adenopathy in the upper abdomen portacaval region. No pathologically enlarged lymph nodes in the abdomen or pelvis. Reproductive: Normal appearance of the uterus and adnexal structures. Other: Small volume low-attenuation fluid layering in the deep pelvis may reflect redistributed reactive free fluid. No free intraperitoneal air. No bowel containing hernias. Musculoskeletal: No acute osseous abnormality or suspicious osseous lesion. Review of the MIP images confirms the above findings. IMPRESSION: Findings of acute diverticulitis centered at distal ascending colon/hepatic flexure with associated pericolonic phlegmon, likely reactive free fluid and adjacent features of peritonitis. No extraluminal gas or organized collection or abscess is seen. Suboptimal evaluation of the pulmonary arteries due to extensive respiratory motion artifact. No large central filling defects are identified. No other acute intrathoracic or abdominopelvic process is seen. Electronically Signed   By: Kreg Shropshire M.D.   On: 01/05/2020 03:26   US Abdomen Limited  Result Date: 01/05/2020 CLINICAL DATA:  34 year old female with right upper quadrant abdominal pain. EXAM: ULTRASOUND ABDOMEN LIMITED RIGHT UPPER QUADRANT COMPARISON:  None. FINDINGS: Gallbladder: No gallstones or wall thickening visualized. No sonographic Murphy sign noted by sonographer. Common bile duct: Diameter: 5 mm Liver: The liver is unremarkable.  Portal vein is patent on color Doppler imaging with normal direction of blood flow towards the liver. Other: There is a trace subhepatic free fluid. IMPRESSION: Trace subhepatic free fluid, otherwise unremarkable right upper quadrant ultrasound. Electronically Signed   By: Elgie Collard M.D.   On: 01/05/2020 02:16    (Echo, Carotid, EGD, Colonoscopy, ERCP)    Subjective:  Patient is resting in bed anxious to go home discussed with husband she has no nausea vomiting after eating she denies any pain Discharge Exam: Vitals:   01/07/20 0929 01/07/20 1317  BP: 113/62 117/62  Pulse: (!) 51 61  Resp:  16  Temp: 98.1 F (36.7 C) 98 F (36.7 C)  SpO2: 99% 98%   Vitals:   01/06/20 2101 01/07/20 0539 01/07/20 0929 01/07/20 1317  BP: 106/65 114/75 113/62 117/62  Pulse: (!) 51 (!) 57 (!) 51 61  Resp: 16 16  16   Temp: 98.2 F (36.8 C) (!) 97.5 F (36.4 C) 98.1 F (36.7 C) 98 F (36.7 C)  TempSrc: Oral Oral Oral Oral  SpO2: 100% 100% 99% 98%  Height: 5\' 2"  (1.575 m)       General: Pt is alert, awake, not in acute distress Cardiovascular: RRR, S1/S2 +, no rubs, no gallops Respiratory: CTA bilaterally, no wheezing, no rhonchi Abdominal: Soft, NT, ND, bowel sounds + Extremities: no edema, no cyanosis    The results of significant diagnostics from this hospitalization (including imaging, microbiology, ancillary and laboratory) are listed below for reference.     Microbiology: Recent Results (from the past 240 hour(s))  SARS CORONAVIRUS 2 (TAT 6-24 HRS) Nasopharyngeal Nasopharyngeal Swab     Status: None   Collection Time: 01/05/20  5:00 AM   Specimen: Nasopharyngeal Swab  Result Value Ref Range Status   SARS Coronavirus 2 NEGATIVE NEGATIVE Final    Comment: (NOTE) SARS-CoV-2 target nucleic acids are NOT DETECTED. The SARS-CoV-2 RNA is generally detectable in upper and lower respiratory specimens during the acute phase of infection. Negative results do not preclude SARS-CoV-2  infection, do not rule out co-infections with other pathogens, and should not be used as the sole basis for treatment or other patient management decisions. Negative results must be combined with clinical observations, patient history, and epidemiological information. The expected result is Negative. Fact Sheet for Patients: SugarRoll.be Fact Sheet for Healthcare Providers: https://www.woods-[employer].com/ This test is not yet approved or cleared by the [place] FDA and  has been authorized for detection and/or diagnosis of SARS-CoV-2 by FDA under an Emergency Use Authorization (EUA). This EUA will remain  in effect (meaning this test can be used) for the duration of the COVID-19 declaration under Section 56 4(b)(1) of the Act, 21 U.S.C. section 360bbb-3(b)(1), unless the authorization is terminated or revoked sooner. Performed at [facility], [place] [address] [place], [place] [address]      Labs: BNP (last 3 results) No results for input(s): BNP in the last 8760 hours. Basic Metabolic Panel: Recent Labs  Lab 01/04/20 2125 01/06/20 0514  NA 139 138  K 3.8 4.5  CL 104 110  CO2 26 16*  GLUCOSE 104* 75  BUN 9 8  CREATININE 0.56 0.44  CALCIUM 8.8* 8.3*   Liver Function Tests: Recent Labs  Lab 01/04/20 2125 01/06/20 0514  AST 16 13*  ALT 12 12  ALKPHOS 46 42  BILITOT 1.4* 0.7  PROT 6.7 6.0*  ALBUMIN 4.4 3.7   Recent Labs  Lab 01/04/20 2125  LIPASE 32   No results for input(s): AMMONIA in the last 168 hours. CBC: Recent Labs  Lab 01/04/20 2125 01/06/20 0514  WBC 8.9 6.2  NEUTROABS  --  4.9  HGB 12.0 11.4*  HCT 37.7 38.3  MCV 94.0 101.1*  PLT 186 165   Cardiac Enzymes: No results for input(s): CKTOTAL, CKMB, CKMBINDEX, TROPONINI in the last 168 hours. BNP: Invalid input(s): POCBNP CBG: Recent Labs  Lab 01/06/20 0722  GLUCAP 65*   D-Dimer No results for input(s): DDIMER in the last 72 hours. Hgb  A1c No results for input(s):  HGBA1C in the last 72 hours. Lipid Profile No results for input(s): CHOL, HDL, LDLCALC, TRIG, CHOLHDL, LDLDIRECT in the last 72 hours. Thyroid function studies No results for input(s): TSH, T4TOTAL, T3FREE, THYROIDAB in the last 72 hours.  Invalid input(s): FREET3 Anemia work up No results for input(s): VITAMINB12, FOLATE, FERRITIN, TIBC, IRON, RETICCTPCT in the last 72 hours. Urinalysis    Component Value Date/Time   COLORURINE YELLOW 01/05/2020 0220   APPEARANCEUR CLEAR 01/05/2020 0220   LABSPEC 1.014 01/05/2020 0220   PHURINE 6.0 01/05/2020 0220   GLUCOSEU NEGATIVE 01/05/2020 0220   HGBUR NEGATIVE 01/05/2020 0220   BILIRUBINUR NEGATIVE 01/05/2020 0220   KETONESUR 20 (A) 01/05/2020 0220   PROTEINUR NEGATIVE 01/05/2020 0220   NITRITE NEGATIVE 01/05/2020 0220   LEUKOCYTESUR NEGATIVE 01/05/2020 0220   Sepsis Labs Invalid input(s): PROCALCITONIN,  WBC,  LACTICIDVEN Microbiology Recent Results (from the past 240 hour(s))  SARS CORONAVIRUS 2 (TAT 6-24 HRS) Nasopharyngeal Nasopharyngeal Swab     Status: None   Collection Time: 01/05/20  5:00 AM   Specimen: Nasopharyngeal Swab  Result Value Ref Range Status   SARS Coronavirus 2 NEGATIVE NEGATIVE Final    Comment: (NOTE) SARS-CoV-2 target nucleic acids are NOT DETECTED. The SARS-CoV-2 RNA is generally detectable in upper and lower respiratory specimens during the acute phase of infection. Negative results do not preclude SARS-CoV-2 infection, do not rule out co-infections with other pathogens, and should not be used as the sole basis for treatment or other patient management decisions. Negative results must be combined with clinical observations, patient history, and epidemiological information. The expected result is Negative. Fact Sheet for Patients: HairSlick.no Fact Sheet for Healthcare Providers: quierodirigir.com This test is not yet  approved or cleared by the [place] FDA and  has been authorized for detection and/or diagnosis of SARS-CoV-2 by FDA under an Emergency Use Authorization (EUA). This EUA will remain  in effect (meaning this test can be used) for the duration of the COVID-19 declaration under Section 56 4(b)(1) of the Act, 21 U.S.C. section 360bbb-3(b)(1), unless the authorization is terminated or revoked sooner. Performed at [facility], [address], [place], [place] [address]      Time coordinating discharge:  38 minutes  SIGNED:   Alwyn Ren, MD  [facility] 01/07/2020, 2:35 PM Pager   If 7PM-7AM, please contact night-coverage [email or web address] Password [email or web address]

## 2020-01-08 DIAGNOSIS — B373 Candidiasis of vulva and vagina: Secondary | ICD-10-CM | POA: Diagnosis not present

## 2020-01-08 DIAGNOSIS — K5792 Diverticulitis of intestine, part unspecified, without perforation or abscess without bleeding: Secondary | ICD-10-CM | POA: Diagnosis not present

## 2020-01-08 DIAGNOSIS — K5900 Constipation, unspecified: Secondary | ICD-10-CM | POA: Diagnosis not present

## 2020-01-08 DIAGNOSIS — K5730 Diverticulosis of large intestine without perforation or abscess without bleeding: Secondary | ICD-10-CM | POA: Diagnosis not present

## 2020-01-17 DIAGNOSIS — R194 Change in bowel habit: Secondary | ICD-10-CM | POA: Diagnosis not present

## 2020-01-17 DIAGNOSIS — R933 Abnormal findings on diagnostic imaging of other parts of digestive tract: Secondary | ICD-10-CM | POA: Diagnosis not present

## 2020-01-17 DIAGNOSIS — K5730 Diverticulosis of large intestine without perforation or abscess without bleeding: Secondary | ICD-10-CM | POA: Diagnosis not present

## 2020-01-17 DIAGNOSIS — Z1211 Encounter for screening for malignant neoplasm of colon: Secondary | ICD-10-CM | POA: Diagnosis not present

## 2020-02-04 DIAGNOSIS — K5730 Diverticulosis of large intestine without perforation or abscess without bleeding: Secondary | ICD-10-CM | POA: Diagnosis not present

## 2020-02-04 DIAGNOSIS — K5900 Constipation, unspecified: Secondary | ICD-10-CM | POA: Diagnosis not present

## 2020-02-04 DIAGNOSIS — F329 Major depressive disorder, single episode, unspecified: Secondary | ICD-10-CM | POA: Diagnosis not present

## 2020-02-04 DIAGNOSIS — K469 Unspecified abdominal hernia without obstruction or gangrene: Secondary | ICD-10-CM | POA: Diagnosis not present

## 2020-03-07 DIAGNOSIS — R933 Abnormal findings on diagnostic imaging of other parts of digestive tract: Secondary | ICD-10-CM | POA: Diagnosis not present

## 2020-03-07 DIAGNOSIS — K5730 Diverticulosis of large intestine without perforation or abscess without bleeding: Secondary | ICD-10-CM | POA: Diagnosis not present

## 2020-03-07 DIAGNOSIS — Z1211 Encounter for screening for malignant neoplasm of colon: Secondary | ICD-10-CM | POA: Diagnosis not present

## 2020-03-07 DIAGNOSIS — R194 Change in bowel habit: Secondary | ICD-10-CM | POA: Diagnosis not present

## 2020-05-16 DIAGNOSIS — M5413 Radiculopathy, cervicothoracic region: Secondary | ICD-10-CM | POA: Diagnosis not present

## 2020-05-16 DIAGNOSIS — M5412 Radiculopathy, cervical region: Secondary | ICD-10-CM | POA: Diagnosis not present

## 2020-05-16 DIAGNOSIS — M7912 Myalgia of auxiliary muscles, head and neck: Secondary | ICD-10-CM | POA: Diagnosis not present

## 2020-05-16 DIAGNOSIS — M9901 Segmental and somatic dysfunction of cervical region: Secondary | ICD-10-CM | POA: Diagnosis not present

## 2020-08-24 DIAGNOSIS — L255 Unspecified contact dermatitis due to plants, except food: Secondary | ICD-10-CM | POA: Diagnosis not present

## 2020-09-15 DIAGNOSIS — M549 Dorsalgia, unspecified: Secondary | ICD-10-CM | POA: Diagnosis not present

## 2020-11-28 IMAGING — CT CT ABD-PELV W/ CM
2 of 4 series · 14 of 46 positions shown, 16 images · IV contrast (APPLIED)
Comparison: Abdominal ultrasound 01/05/2020

CLINICAL DATA: Right lower quadrant pain and abdominal distension,
shortness of breath

EXAM:
CT ANGIOGRAPHY CHEST
CT ABDOMEN AND PELVIS WITH CONTRAST
TECHNIQUE: Multidetector CT imaging of the chest was performed using the
standard protocol during bolus administration of intravenous
contrast. Multiplanar CT image reconstructions and MIPs were
obtained to evaluate the vascular anatomy. Multidetector CT imaging
of the abdomen and pelvis was performed using the standard protocol
during bolus administration of intravenous contrast.
CONTRAST:  100mL OMNIPAQUE IOHEXOL 350 MG/ML SOLN

[Series 4: axial st · axial · 0.65mm/px · z∈[-581,-221]mm · 11 of 81 slices shown, 13 images]
[im 5/81  soft-tissue]
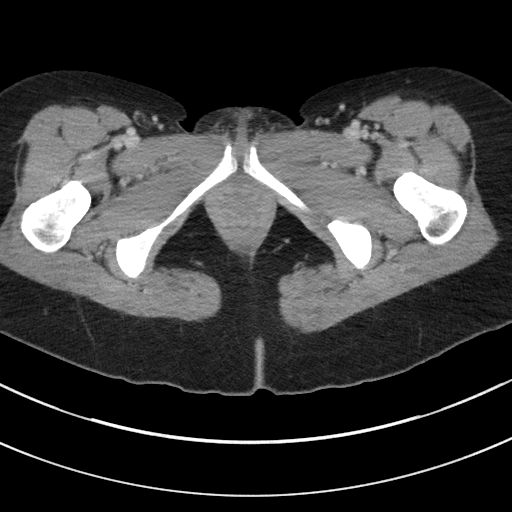
[im 5/81  bone]
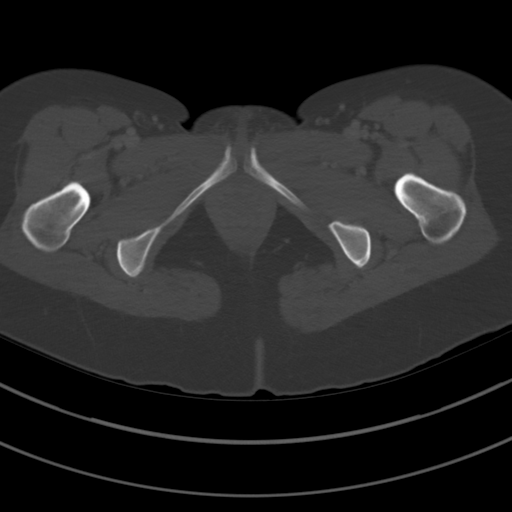
[im 13/81  soft-tissue]
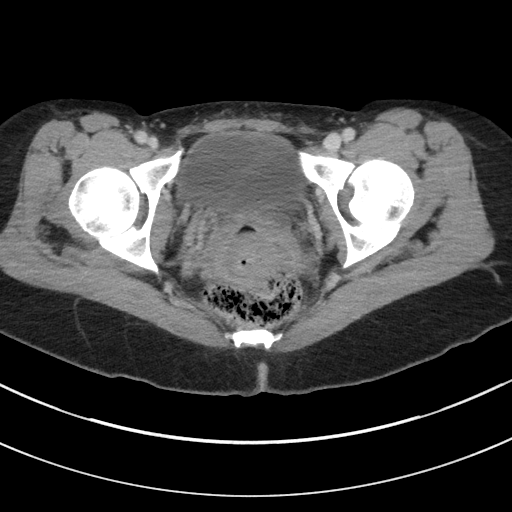
[im 21/81  soft-tissue]
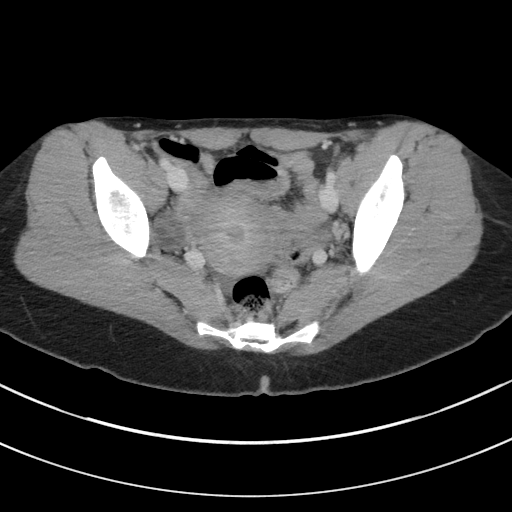
[im 29/81  soft-tissue]
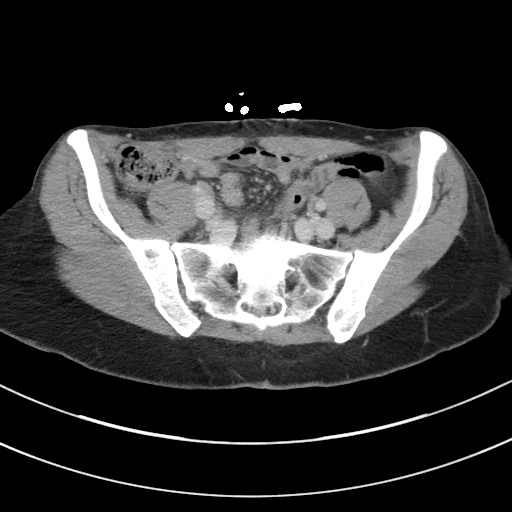
[im 33/81  soft-tissue]
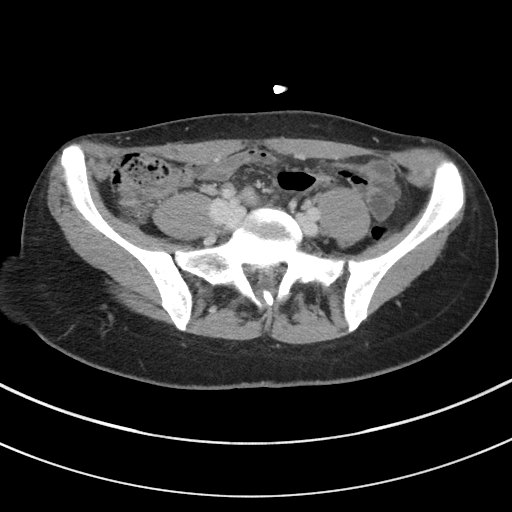
[im 41/81  soft-tissue]
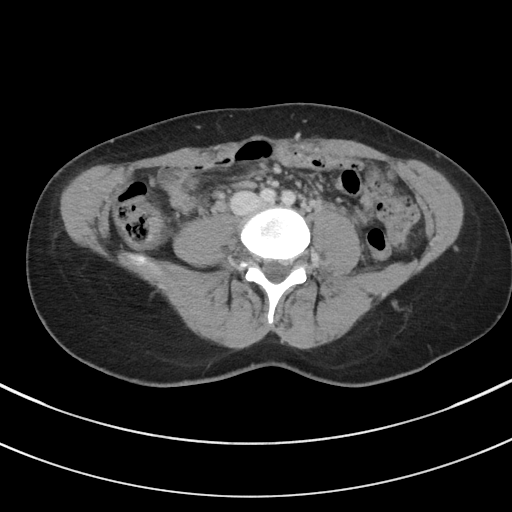
[im 49/81  soft-tissue]
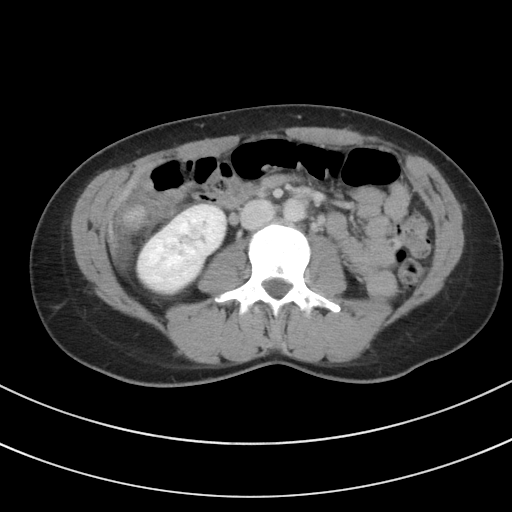
[im 53/81  soft-tissue]
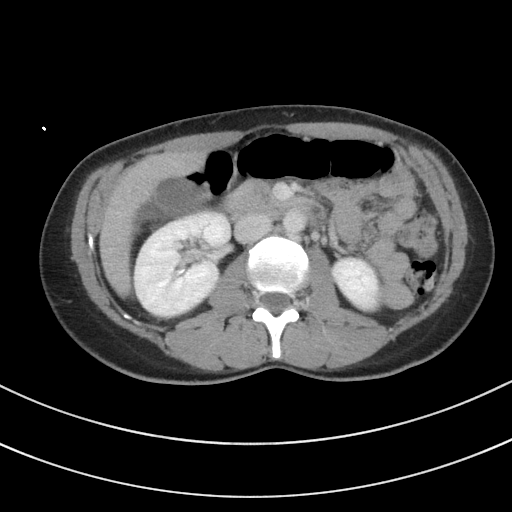
[im 61/81  soft-tissue]
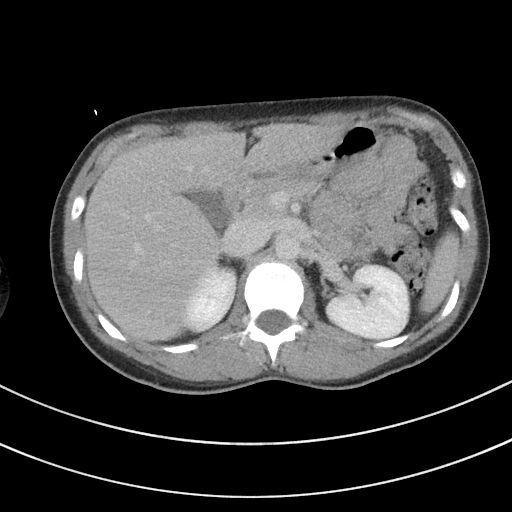
[im 61/81  bone]
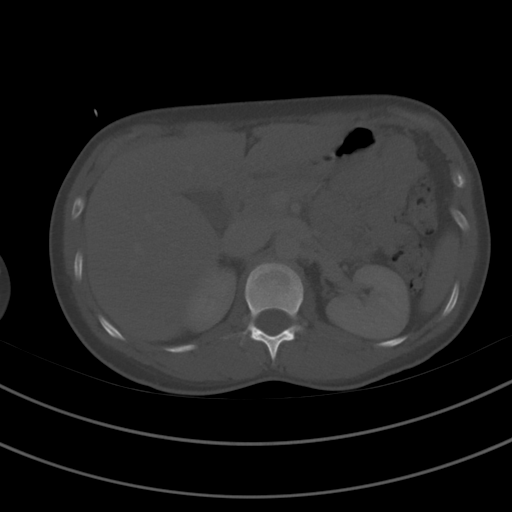
[im 69/81  soft-tissue]
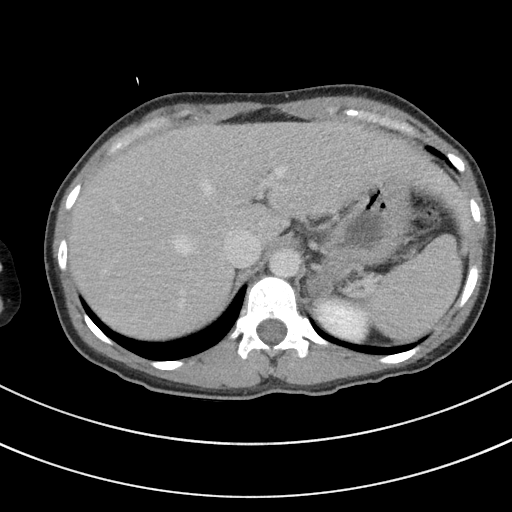
[im 77/81  soft-tissue]
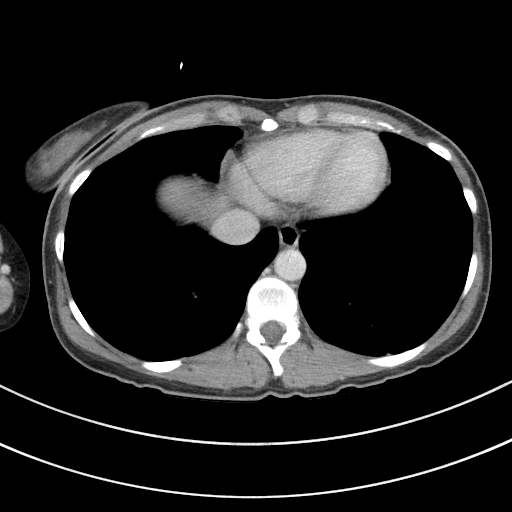

[Series 7: coronal st · coronal · 0.64mm/px · 3 of 68 slices shown]
[im 23/68  soft-tissue]
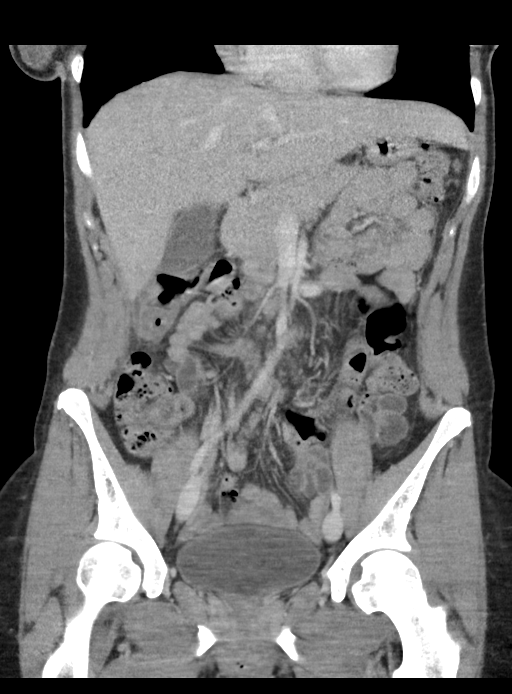
[im 30/68  soft-tissue]
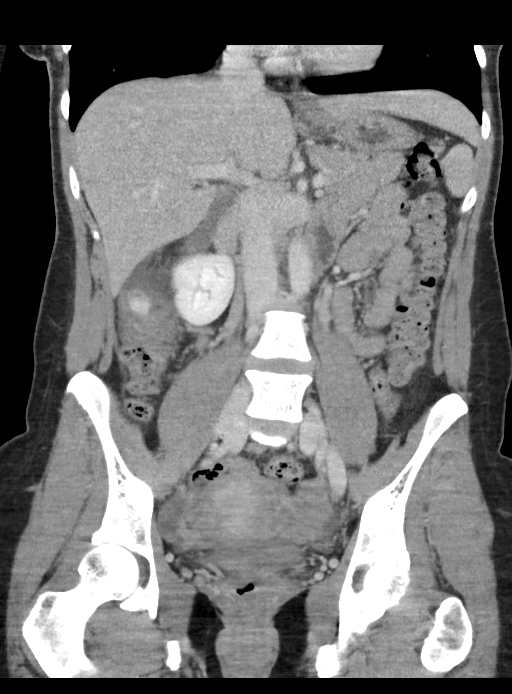
[im 38/68  soft-tissue]
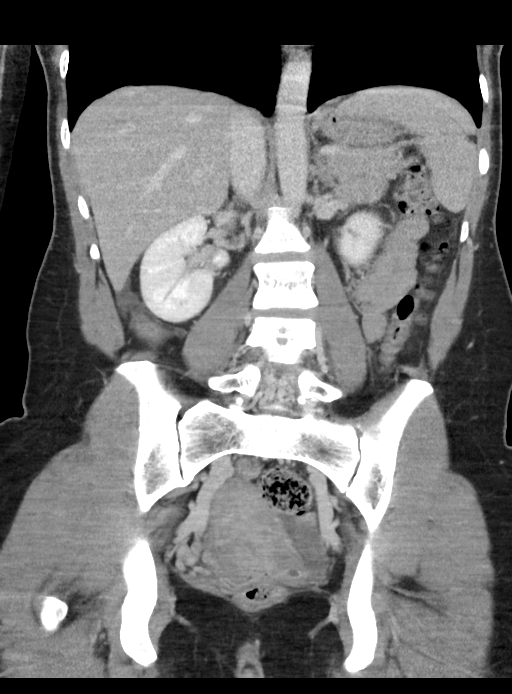

[14 of 46 positions shown; findings below may reference images not displayed]

FINDINGS: CTA CHEST FINDINGS

Cardiovascular: There is borderline opacification of the pulmonary
arteries and extensive respiratory motion artifact which limits
evaluation beyond the central pulmonary arteries. No large central
filling defects are identified. Central pulmonary arteries are
normal caliber. The aortic root is suboptimally assessed given
cardiac pulsation artifact. The aorta is normal caliber. No
intramural hematoma, dissection flap or other acute luminal
abnormality of the aorta is seen. No periaortic stranding or
hemorrhage.

Mediastinum/Nodes: No mediastinal fluid or gas. Normal thyroid gland
and thoracic inlet. No acute abnormality of the trachea or
esophagus. No worrisome mediastinal, hilar or axillary adenopathy.

Lungs/Pleura: No consolidation, features of edema, pneumothorax, or
effusion. Atelectatic changes seen dependently. No pneumothorax or
effusion.

Musculoskeletal: Mild straightening of the thoracic kyphosis. No
chest wall mass or suspicious bone lesions identified.

Review of the MIP images confirms the above findings.

CT ABDOMEN and PELVIS FINDINGS

Hepatobiliary: No focal liver abnormality is seen. No gallstones,
gallbladder wall thickening, or biliary dilatation. Some mild
pericholecystic inflammatory change appears more focally centered
upon the adjacent thickened colon.

Pancreas: Unremarkable. No pancreatic ductal dilatation or
surrounding inflammatory changes.

Spleen: Normal in size without focal abnormality.

Adrenals/Urinary Tract: Adrenal glands are unremarkable. Kidneys are
normal, without renal calculi, focal lesion, or hydronephrosis.
Bladder is unremarkable.

Stomach/Bowel: Distal esophagus, stomach and duodenal sweep are
unremarkable. No small bowel wall thickening or dilatation. No
evidence of obstruction. A normal appendix is visualized. There is a
focally thickened segment of the distal ascending colon and hepatic
flexure which appears centered upon a diverticular outpouching
containing a large calcified fecalith ([DATE]) adjacent edematous and
phlegmonous changes noted. Some reactive peritoneal thickening and
enhancement is noted without discernible abscess or collection at
this time. More distal colonic segments have a normal appearance.
Few scattered noninflamed distal colonic diverticular present.

Vascular/Lymphatic: The aorta is normal caliber. Reactive adenopathy
in the upper abdomen portacaval region. No pathologically enlarged
lymph nodes in the abdomen or pelvis.

Reproductive: Normal appearance of the uterus and adnexal
structures.

Other: Small volume low-attenuation fluid layering in the deep
pelvis may reflect redistributed reactive free fluid. No free
intraperitoneal air. No bowel containing hernias.

Musculoskeletal: No acute osseous abnormality or suspicious osseous
lesion.

Review of the MIP images confirms the above findings.
IMPRESSION: Findings of acute diverticulitis centered at distal ascending
colon/hepatic flexure with associated pericolonic phlegmon, likely
reactive free fluid and adjacent features of peritonitis. No
extraluminal gas or organized collection or abscess is seen.

Suboptimal evaluation of the pulmonary arteries due to extensive
respiratory motion artifact. No large central filling defects are
identified.

No other acute intrathoracic or abdominopelvic process is seen.

## 2020-12-19 DIAGNOSIS — S7010XA Contusion of unspecified thigh, initial encounter: Secondary | ICD-10-CM | POA: Diagnosis not present

## 2021-03-10 DIAGNOSIS — Z0000 Encounter for general adult medical examination without abnormal findings: Secondary | ICD-10-CM | POA: Diagnosis not present

## 2021-03-11 DIAGNOSIS — Z0000 Encounter for general adult medical examination without abnormal findings: Secondary | ICD-10-CM | POA: Diagnosis not present

## 2021-03-16 DIAGNOSIS — Z1331 Encounter for screening for depression: Secondary | ICD-10-CM | POA: Diagnosis not present

## 2021-03-16 DIAGNOSIS — Z0000 Encounter for general adult medical examination without abnormal findings: Secondary | ICD-10-CM | POA: Diagnosis not present

## 2021-03-16 DIAGNOSIS — J309 Allergic rhinitis, unspecified: Secondary | ICD-10-CM | POA: Diagnosis not present

## 2021-03-16 DIAGNOSIS — R0789 Other chest pain: Secondary | ICD-10-CM | POA: Diagnosis not present

## 2021-04-14 DIAGNOSIS — N3090 Cystitis, unspecified without hematuria: Secondary | ICD-10-CM | POA: Diagnosis not present

## 2021-07-21 DIAGNOSIS — M9902 Segmental and somatic dysfunction of thoracic region: Secondary | ICD-10-CM | POA: Diagnosis not present

## 2021-07-21 DIAGNOSIS — M9901 Segmental and somatic dysfunction of cervical region: Secondary | ICD-10-CM | POA: Diagnosis not present

## 2021-07-21 DIAGNOSIS — M5384 Other specified dorsopathies, thoracic region: Secondary | ICD-10-CM | POA: Diagnosis not present

## 2021-07-21 DIAGNOSIS — M5382 Other specified dorsopathies, cervical region: Secondary | ICD-10-CM | POA: Diagnosis not present

## 2021-07-28 DIAGNOSIS — M5384 Other specified dorsopathies, thoracic region: Secondary | ICD-10-CM | POA: Diagnosis not present

## 2021-07-28 DIAGNOSIS — M5382 Other specified dorsopathies, cervical region: Secondary | ICD-10-CM | POA: Diagnosis not present

## 2021-07-28 DIAGNOSIS — M9902 Segmental and somatic dysfunction of thoracic region: Secondary | ICD-10-CM | POA: Diagnosis not present

## 2021-07-28 DIAGNOSIS — M9901 Segmental and somatic dysfunction of cervical region: Secondary | ICD-10-CM | POA: Diagnosis not present

## 2021-12-15 DIAGNOSIS — Z124 Encounter for screening for malignant neoplasm of cervix: Secondary | ICD-10-CM | POA: Diagnosis not present

## 2021-12-15 DIAGNOSIS — Z6823 Body mass index (BMI) 23.0-23.9, adult: Secondary | ICD-10-CM | POA: Diagnosis not present

## 2021-12-15 DIAGNOSIS — Z1151 Encounter for screening for human papillomavirus (HPV): Secondary | ICD-10-CM | POA: Diagnosis not present

## 2021-12-15 DIAGNOSIS — Z01419 Encounter for gynecological examination (general) (routine) without abnormal findings: Secondary | ICD-10-CM | POA: Diagnosis not present

## 2022-03-15 DIAGNOSIS — F419 Anxiety disorder, unspecified: Secondary | ICD-10-CM | POA: Diagnosis not present

## 2022-03-18 DIAGNOSIS — Z1331 Encounter for screening for depression: Secondary | ICD-10-CM | POA: Diagnosis not present

## 2022-03-18 DIAGNOSIS — R82998 Other abnormal findings in urine: Secondary | ICD-10-CM | POA: Diagnosis not present

## 2022-03-18 DIAGNOSIS — Z1339 Encounter for screening examination for other mental health and behavioral disorders: Secondary | ICD-10-CM | POA: Diagnosis not present

## 2022-03-18 DIAGNOSIS — Z0000 Encounter for general adult medical examination without abnormal findings: Secondary | ICD-10-CM | POA: Diagnosis not present

## 2022-03-18 DIAGNOSIS — J309 Allergic rhinitis, unspecified: Secondary | ICD-10-CM | POA: Diagnosis not present

## 2022-08-02 DIAGNOSIS — R5382 Chronic fatigue, unspecified: Secondary | ICD-10-CM | POA: Diagnosis not present

## 2022-08-02 DIAGNOSIS — R3915 Urgency of urination: Secondary | ICD-10-CM | POA: Diagnosis not present

## 2023-03-22 DIAGNOSIS — R7989 Other specified abnormal findings of blood chemistry: Secondary | ICD-10-CM | POA: Diagnosis not present

## 2023-03-22 DIAGNOSIS — F419 Anxiety disorder, unspecified: Secondary | ICD-10-CM | POA: Diagnosis not present

## 2023-03-22 DIAGNOSIS — Z0000 Encounter for general adult medical examination without abnormal findings: Secondary | ICD-10-CM | POA: Diagnosis not present

## 2023-03-24 DIAGNOSIS — K5900 Constipation, unspecified: Secondary | ICD-10-CM | POA: Diagnosis not present

## 2023-03-24 DIAGNOSIS — F329 Major depressive disorder, single episode, unspecified: Secondary | ICD-10-CM | POA: Diagnosis not present

## 2023-03-24 DIAGNOSIS — R82998 Other abnormal findings in urine: Secondary | ICD-10-CM | POA: Diagnosis not present

## 2023-03-24 DIAGNOSIS — F419 Anxiety disorder, unspecified: Secondary | ICD-10-CM | POA: Diagnosis not present

## 2023-03-24 DIAGNOSIS — J309 Allergic rhinitis, unspecified: Secondary | ICD-10-CM | POA: Diagnosis not present

## 2023-03-24 DIAGNOSIS — Z0000 Encounter for general adult medical examination without abnormal findings: Secondary | ICD-10-CM | POA: Diagnosis not present

## 2023-03-24 DIAGNOSIS — Z1331 Encounter for screening for depression: Secondary | ICD-10-CM | POA: Diagnosis not present

## 2023-05-27 DIAGNOSIS — F411 Generalized anxiety disorder: Secondary | ICD-10-CM | POA: Diagnosis not present

## 2023-05-27 DIAGNOSIS — F322 Major depressive disorder, single episode, severe without psychotic features: Secondary | ICD-10-CM | POA: Diagnosis not present

## 2023-05-31 DIAGNOSIS — F419 Anxiety disorder, unspecified: Secondary | ICD-10-CM | POA: Diagnosis not present

## 2023-05-31 DIAGNOSIS — F321 Major depressive disorder, single episode, moderate: Secondary | ICD-10-CM | POA: Diagnosis not present

## 2023-05-31 DIAGNOSIS — F319 Bipolar disorder, unspecified: Secondary | ICD-10-CM | POA: Diagnosis not present

## 2023-06-08 DIAGNOSIS — F411 Generalized anxiety disorder: Secondary | ICD-10-CM | POA: Diagnosis not present

## 2023-06-08 DIAGNOSIS — F322 Major depressive disorder, single episode, severe without psychotic features: Secondary | ICD-10-CM | POA: Diagnosis not present

## 2023-06-22 DIAGNOSIS — F411 Generalized anxiety disorder: Secondary | ICD-10-CM | POA: Diagnosis not present

## 2023-06-22 DIAGNOSIS — F322 Major depressive disorder, single episode, severe without psychotic features: Secondary | ICD-10-CM | POA: Diagnosis not present

## 2023-06-23 DIAGNOSIS — M40202 Unspecified kyphosis, cervical region: Secondary | ICD-10-CM | POA: Diagnosis not present

## 2023-07-06 DIAGNOSIS — F329 Major depressive disorder, single episode, unspecified: Secondary | ICD-10-CM | POA: Diagnosis not present

## 2023-07-06 DIAGNOSIS — F411 Generalized anxiety disorder: Secondary | ICD-10-CM | POA: Diagnosis not present

## 2023-07-14 DIAGNOSIS — F322 Major depressive disorder, single episode, severe without psychotic features: Secondary | ICD-10-CM | POA: Diagnosis not present

## 2023-07-14 DIAGNOSIS — F411 Generalized anxiety disorder: Secondary | ICD-10-CM | POA: Diagnosis not present

## 2023-07-21 DIAGNOSIS — F909 Attention-deficit hyperactivity disorder, unspecified type: Secondary | ICD-10-CM | POA: Diagnosis not present

## 2023-07-21 DIAGNOSIS — F411 Generalized anxiety disorder: Secondary | ICD-10-CM | POA: Diagnosis not present

## 2023-07-21 DIAGNOSIS — F329 Major depressive disorder, single episode, unspecified: Secondary | ICD-10-CM | POA: Diagnosis not present

## 2023-07-29 DIAGNOSIS — F909 Attention-deficit hyperactivity disorder, unspecified type: Secondary | ICD-10-CM | POA: Diagnosis not present

## 2023-07-29 DIAGNOSIS — F411 Generalized anxiety disorder: Secondary | ICD-10-CM | POA: Diagnosis not present

## 2023-07-29 DIAGNOSIS — F329 Major depressive disorder, single episode, unspecified: Secondary | ICD-10-CM | POA: Diagnosis not present

## 2023-08-01 DIAGNOSIS — F902 Attention-deficit hyperactivity disorder, combined type: Secondary | ICD-10-CM | POA: Diagnosis not present

## 2023-08-04 DIAGNOSIS — F329 Major depressive disorder, single episode, unspecified: Secondary | ICD-10-CM | POA: Diagnosis not present

## 2023-08-04 DIAGNOSIS — F411 Generalized anxiety disorder: Secondary | ICD-10-CM | POA: Diagnosis not present

## 2023-08-04 DIAGNOSIS — F909 Attention-deficit hyperactivity disorder, unspecified type: Secondary | ICD-10-CM | POA: Diagnosis not present

## 2023-08-10 DIAGNOSIS — F902 Attention-deficit hyperactivity disorder, combined type: Secondary | ICD-10-CM | POA: Diagnosis not present

## 2023-08-17 DIAGNOSIS — F909 Attention-deficit hyperactivity disorder, unspecified type: Secondary | ICD-10-CM | POA: Diagnosis not present

## 2023-08-17 DIAGNOSIS — F1010 Alcohol abuse, uncomplicated: Secondary | ICD-10-CM | POA: Diagnosis not present

## 2023-08-17 DIAGNOSIS — F329 Major depressive disorder, single episode, unspecified: Secondary | ICD-10-CM | POA: Diagnosis not present

## 2023-08-17 DIAGNOSIS — F411 Generalized anxiety disorder: Secondary | ICD-10-CM | POA: Diagnosis not present

## 2023-08-26 DIAGNOSIS — F411 Generalized anxiety disorder: Secondary | ICD-10-CM | POA: Diagnosis not present

## 2023-08-26 DIAGNOSIS — F4322 Adjustment disorder with anxiety: Secondary | ICD-10-CM | POA: Diagnosis not present

## 2023-08-26 DIAGNOSIS — F902 Attention-deficit hyperactivity disorder, combined type: Secondary | ICD-10-CM | POA: Diagnosis not present

## 2023-09-15 DIAGNOSIS — F329 Major depressive disorder, single episode, unspecified: Secondary | ICD-10-CM | POA: Diagnosis not present

## 2023-09-15 DIAGNOSIS — F909 Attention-deficit hyperactivity disorder, unspecified type: Secondary | ICD-10-CM | POA: Diagnosis not present

## 2023-09-15 DIAGNOSIS — F411 Generalized anxiety disorder: Secondary | ICD-10-CM | POA: Diagnosis not present

## 2023-09-15 DIAGNOSIS — F1010 Alcohol abuse, uncomplicated: Secondary | ICD-10-CM | POA: Diagnosis not present

## 2023-09-26 DIAGNOSIS — F4323 Adjustment disorder with mixed anxiety and depressed mood: Secondary | ICD-10-CM | POA: Diagnosis not present

## 2023-09-26 DIAGNOSIS — F902 Attention-deficit hyperactivity disorder, combined type: Secondary | ICD-10-CM | POA: Diagnosis not present

## 2023-09-26 DIAGNOSIS — F411 Generalized anxiety disorder: Secondary | ICD-10-CM | POA: Diagnosis not present

## 2023-10-04 DIAGNOSIS — F1010 Alcohol abuse, uncomplicated: Secondary | ICD-10-CM | POA: Diagnosis not present

## 2023-10-04 DIAGNOSIS — F411 Generalized anxiety disorder: Secondary | ICD-10-CM | POA: Diagnosis not present

## 2023-10-04 DIAGNOSIS — F329 Major depressive disorder, single episode, unspecified: Secondary | ICD-10-CM | POA: Diagnosis not present

## 2023-10-04 DIAGNOSIS — F909 Attention-deficit hyperactivity disorder, unspecified type: Secondary | ICD-10-CM | POA: Diagnosis not present

## 2023-10-05 DIAGNOSIS — F3181 Bipolar II disorder: Secondary | ICD-10-CM | POA: Diagnosis not present

## 2023-10-05 DIAGNOSIS — F4323 Adjustment disorder with mixed anxiety and depressed mood: Secondary | ICD-10-CM | POA: Diagnosis not present

## 2023-10-05 DIAGNOSIS — F411 Generalized anxiety disorder: Secondary | ICD-10-CM | POA: Diagnosis not present

## 2023-10-05 DIAGNOSIS — F902 Attention-deficit hyperactivity disorder, combined type: Secondary | ICD-10-CM | POA: Diagnosis not present

## 2023-10-11 DIAGNOSIS — F419 Anxiety disorder, unspecified: Secondary | ICD-10-CM | POA: Diagnosis not present

## 2023-10-11 DIAGNOSIS — F909 Attention-deficit hyperactivity disorder, unspecified type: Secondary | ICD-10-CM | POA: Diagnosis not present

## 2023-10-11 DIAGNOSIS — F4322 Adjustment disorder with anxiety: Secondary | ICD-10-CM | POA: Diagnosis not present

## 2023-10-11 DIAGNOSIS — F321 Major depressive disorder, single episode, moderate: Secondary | ICD-10-CM | POA: Diagnosis not present

## 2023-10-17 DIAGNOSIS — F902 Attention-deficit hyperactivity disorder, combined type: Secondary | ICD-10-CM | POA: Diagnosis not present

## 2023-10-17 DIAGNOSIS — F411 Generalized anxiety disorder: Secondary | ICD-10-CM | POA: Diagnosis not present

## 2023-10-17 DIAGNOSIS — F4323 Adjustment disorder with mixed anxiety and depressed mood: Secondary | ICD-10-CM | POA: Diagnosis not present

## 2023-10-17 DIAGNOSIS — F3181 Bipolar II disorder: Secondary | ICD-10-CM | POA: Diagnosis not present

## 2023-10-31 DIAGNOSIS — F4323 Adjustment disorder with mixed anxiety and depressed mood: Secondary | ICD-10-CM | POA: Diagnosis not present

## 2023-10-31 DIAGNOSIS — F411 Generalized anxiety disorder: Secondary | ICD-10-CM | POA: Diagnosis not present

## 2023-10-31 DIAGNOSIS — F3181 Bipolar II disorder: Secondary | ICD-10-CM | POA: Diagnosis not present

## 2023-10-31 DIAGNOSIS — F902 Attention-deficit hyperactivity disorder, combined type: Secondary | ICD-10-CM | POA: Diagnosis not present

## 2023-11-02 DIAGNOSIS — F321 Major depressive disorder, single episode, moderate: Secondary | ICD-10-CM | POA: Diagnosis not present

## 2023-11-02 DIAGNOSIS — F909 Attention-deficit hyperactivity disorder, unspecified type: Secondary | ICD-10-CM | POA: Diagnosis not present

## 2023-11-02 DIAGNOSIS — F4322 Adjustment disorder with anxiety: Secondary | ICD-10-CM | POA: Diagnosis not present

## 2023-11-15 DIAGNOSIS — F411 Generalized anxiety disorder: Secondary | ICD-10-CM | POA: Diagnosis not present

## 2023-11-15 DIAGNOSIS — F329 Major depressive disorder, single episode, unspecified: Secondary | ICD-10-CM | POA: Diagnosis not present

## 2023-11-15 DIAGNOSIS — F909 Attention-deficit hyperactivity disorder, unspecified type: Secondary | ICD-10-CM | POA: Diagnosis not present

## 2023-12-05 DIAGNOSIS — F3181 Bipolar II disorder: Secondary | ICD-10-CM | POA: Diagnosis not present

## 2023-12-05 DIAGNOSIS — F4323 Adjustment disorder with mixed anxiety and depressed mood: Secondary | ICD-10-CM | POA: Diagnosis not present

## 2023-12-05 DIAGNOSIS — F411 Generalized anxiety disorder: Secondary | ICD-10-CM | POA: Diagnosis not present

## 2023-12-05 DIAGNOSIS — F902 Attention-deficit hyperactivity disorder, combined type: Secondary | ICD-10-CM | POA: Diagnosis not present

## 2023-12-09 DIAGNOSIS — H6500 Acute serous otitis media, unspecified ear: Secondary | ICD-10-CM | POA: Diagnosis not present

## 2023-12-09 DIAGNOSIS — H9203 Otalgia, bilateral: Secondary | ICD-10-CM | POA: Diagnosis not present

## 2024-01-10 DIAGNOSIS — F900 Attention-deficit hyperactivity disorder, predominantly inattentive type: Secondary | ICD-10-CM | POA: Diagnosis not present

## 2024-01-10 DIAGNOSIS — F331 Major depressive disorder, recurrent, moderate: Secondary | ICD-10-CM | POA: Diagnosis not present

## 2024-01-10 DIAGNOSIS — F411 Generalized anxiety disorder: Secondary | ICD-10-CM | POA: Diagnosis not present

## 2024-01-13 DIAGNOSIS — F411 Generalized anxiety disorder: Secondary | ICD-10-CM | POA: Diagnosis not present

## 2024-01-13 DIAGNOSIS — F4323 Adjustment disorder with mixed anxiety and depressed mood: Secondary | ICD-10-CM | POA: Diagnosis not present

## 2024-01-13 DIAGNOSIS — F3181 Bipolar II disorder: Secondary | ICD-10-CM | POA: Diagnosis not present

## 2024-01-13 DIAGNOSIS — F902 Attention-deficit hyperactivity disorder, combined type: Secondary | ICD-10-CM | POA: Diagnosis not present

## 2024-01-31 DIAGNOSIS — F901 Attention-deficit hyperactivity disorder, predominantly hyperactive type: Secondary | ICD-10-CM | POA: Diagnosis not present

## 2024-02-21 DIAGNOSIS — F901 Attention-deficit hyperactivity disorder, predominantly hyperactive type: Secondary | ICD-10-CM | POA: Diagnosis not present

## 2024-03-08 DIAGNOSIS — F901 Attention-deficit hyperactivity disorder, predominantly hyperactive type: Secondary | ICD-10-CM | POA: Diagnosis not present

## 2024-03-13 DIAGNOSIS — F901 Attention-deficit hyperactivity disorder, predominantly hyperactive type: Secondary | ICD-10-CM | POA: Diagnosis not present

## 2024-04-03 DIAGNOSIS — Z0000 Encounter for general adult medical examination without abnormal findings: Secondary | ICD-10-CM | POA: Diagnosis not present

## 2024-04-03 DIAGNOSIS — F4323 Adjustment disorder with mixed anxiety and depressed mood: Secondary | ICD-10-CM | POA: Diagnosis not present

## 2024-04-03 DIAGNOSIS — Z1231 Encounter for screening mammogram for malignant neoplasm of breast: Secondary | ICD-10-CM | POA: Diagnosis not present

## 2024-04-03 DIAGNOSIS — F902 Attention-deficit hyperactivity disorder, combined type: Secondary | ICD-10-CM | POA: Diagnosis not present

## 2024-04-05 DIAGNOSIS — F901 Attention-deficit hyperactivity disorder, predominantly hyperactive type: Secondary | ICD-10-CM | POA: Diagnosis not present

## 2024-04-06 DIAGNOSIS — F901 Attention-deficit hyperactivity disorder, predominantly hyperactive type: Secondary | ICD-10-CM | POA: Diagnosis not present

## 2024-04-09 DIAGNOSIS — F901 Attention-deficit hyperactivity disorder, predominantly hyperactive type: Secondary | ICD-10-CM | POA: Diagnosis not present

## 2024-04-13 DIAGNOSIS — F901 Attention-deficit hyperactivity disorder, predominantly hyperactive type: Secondary | ICD-10-CM | POA: Diagnosis not present

## 2024-04-20 DIAGNOSIS — F319 Bipolar disorder, unspecified: Secondary | ICD-10-CM | POA: Diagnosis not present

## 2024-04-24 DIAGNOSIS — Z1231 Encounter for screening mammogram for malignant neoplasm of breast: Secondary | ICD-10-CM | POA: Diagnosis not present

## 2024-06-19 DIAGNOSIS — F319 Bipolar disorder, unspecified: Secondary | ICD-10-CM | POA: Diagnosis not present

## 2024-06-26 DIAGNOSIS — F319 Bipolar disorder, unspecified: Secondary | ICD-10-CM | POA: Diagnosis not present
